# Patient Record
Sex: Female | Born: 2000 | Race: White | Hispanic: No | Marital: Single | Smoking: Never smoker
Health system: Southern US, Community
[De-identification: ages and names within clinical notes are randomized; demographics above are authoritative.]

## PROBLEM LIST (undated history)

## (undated) DIAGNOSIS — J9819 Other pulmonary collapse: Secondary | ICD-10-CM

## (undated) DIAGNOSIS — R000 Tachycardia, unspecified: Secondary | ICD-10-CM

## (undated) DIAGNOSIS — I951 Orthostatic hypotension: Secondary | ICD-10-CM

## (undated) DIAGNOSIS — G90A Postural orthostatic tachycardia syndrome (POTS): Secondary | ICD-10-CM

## (undated) HISTORY — PX: TONSILLECTOMY: SUR1361

## (undated) HISTORY — PX: OTHER SURGICAL HISTORY: SHX169

## (undated) HISTORY — DX: Tachycardia, unspecified: R00.0

---
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
## 2019-01-03 LAB — NUSWAB VAGINITIS PLUS (VG+)
Candida albicans, NAA: NEGATIVE
Candida glabrata, NAA: NEGATIVE
Chlamydia trachomatis, NAA: NEGATIVE
Neisseria gonorrhoeae, NAA: NEGATIVE
Trich vag by NAA: NEGATIVE

## 2020-06-19 ENCOUNTER — Telehealth: Payer: Self-pay | Admitting: Obstetrics and Gynecology

## 2020-06-19 NOTE — Telephone Encounter (Signed)
Patient is scheduled for annual on 07/16/2020 at 1:30 pm with dr. Bonney Aid , pt is requesting Mirena IUD replacement.

## 2020-06-25 NOTE — Telephone Encounter (Signed)
Noted. Will order to arrive by apt date/time. 

## 2020-07-16 ENCOUNTER — Ambulatory Visit (INDEPENDENT_AMBULATORY_CARE_PROVIDER_SITE_OTHER): Payer: BC Managed Care – PPO | Admitting: Obstetrics and Gynecology

## 2020-07-16 ENCOUNTER — Encounter: Payer: Self-pay | Admitting: Obstetrics and Gynecology

## 2020-07-16 ENCOUNTER — Other Ambulatory Visit: Payer: Self-pay

## 2020-07-16 ENCOUNTER — Other Ambulatory Visit (HOSPITAL_COMMUNITY)
Admission: RE | Admit: 2020-07-16 | Discharge: 2020-07-16 | Disposition: A | Discharge status: Home / Self Care | Payer: BC Managed Care – PPO | Source: Ambulatory Visit | Attending: Obstetrics and Gynecology | Admitting: Obstetrics and Gynecology

## 2020-07-16 VITALS — BP 122/72 | Ht 63.0 in | Wt 185.0 lb

## 2020-07-16 DIAGNOSIS — Z113 Encounter for screening for infections with a predominantly sexual mode of transmission: Secondary | ICD-10-CM | POA: Insufficient documentation

## 2020-07-16 DIAGNOSIS — Z01419 Encounter for gynecological examination (general) (routine) without abnormal findings: Secondary | ICD-10-CM | POA: Diagnosis not present

## 2020-07-16 DIAGNOSIS — Z30433 Encounter for removal and reinsertion of intrauterine contraceptive device: Secondary | ICD-10-CM

## 2020-07-16 DIAGNOSIS — L0292 Furuncle, unspecified: Secondary | ICD-10-CM

## 2020-07-16 NOTE — Progress Notes (Addendum)
Gynecology Annual Exam  PCP: Lestine Mount, PA-C  Chief Complaint:  Chief Complaint  Patient presents with   Gynecologic Exam    Annual, IUD replacement. RM 5    History of Present Illness: Patient is a 20 y.o. G0P0000 presents for annual exam. The patient has no complaints today.   LMP: No LMP recorded. (Menstrual status: IUD). No bleeding concerns with current Maria Buckley  The patient is sexually active. She currently uses IUD for contraception. She denies dyspareunia.  The patient does perform self breast exams.  There is no notable family history of breast or ovarian cancer in her family.  The patient wears seatbelts: yes.  The patient has regular exercise: not asked.    The patient denies current symptoms of depression.    Review of Systems: Review of Systems  Constitutional: Negative for chills and fever.  HENT: Negative for congestion.   Respiratory: Negative for cough and shortness of breath.   Cardiovascular: Negative for chest pain and palpitations.  Gastrointestinal: Negative for abdominal pain, constipation, diarrhea, heartburn, nausea and vomiting.  Genitourinary: Negative for dysuria, frequency and urgency.  Skin: Negative for itching and rash.  Neurological: Negative for dizziness and headaches.  Endo/Heme/Allergies: Negative for polydipsia.  Psychiatric/Behavioral: Negative for depression.    Past Medical History:  Patient Active Problem List   Diagnosis Date Noted   Acute appendicitis 06/20/2018    Past Surgical History:  Past Surgical History:  Procedure Laterality Date   APPENDECTOMY  06/2018   LAPAROSCOPIC APPENDECTOMY N/A 06/20/2018   Procedure: APPENDECTOMY LAPAROSCOPIC;  Surgeon: Benjamine Sprague, DO;  Location: [facility] ORS;  Service: General;  Laterality: N/A;   TONSILLECTOMY      Gynecologic History:  No LMP recorded. (Menstrual status: IUD). Contraception:08/15/2015 Kylena IUD Last Pap: Results NA under 21  Obstetric History:  G0P0000  Family History:  Family History  Problem Relation Age of Onset   Breast cancer Maternal Grandmother 53   Melanoma Maternal Grandmother        Malignant   Cancer Maternal Grandmother 40       has contact   Pancreatic cancer Other     Social History:  Social History   Socioeconomic History   Marital status: Single    Spouse name: Not on file   Number of children: Not on file   Years of education: Not on file   Highest education level: Not on file  Occupational History   Not on file  Tobacco Use   Smoking status: Passive Smoke Exposure - Never Smoker   Smokeless tobacco: Never Used  Vaping Use   Vaping Use: Never used  Substance and Sexual Activity   Alcohol use: No   Drug use: No   Sexual activity: Yes    Birth control/protection: I.U.D.  Other Topics Concern   Not on file  Social History Narrative   Not on file   Social Determinants of Health   Financial Resource Strain: Not on file  Food Insecurity: Not on file  Transportation Needs: Not on file  Physical Activity: Not on file  Stress: Not on file  Social Connections: Not on file  Intimate Partner Violence: Not on file    Allergies:  No Known Allergies  Medications: Prior to Admission medications   Medication Sig Start Date End Date Taking? Authorizing Provider  levonorgestrel (MIRENA) 20 MCG/24HR IUD 1 each by Intrauterine route once.    [provider]  montelukast (SINGULAIR) 10 MG tablet Take 10 mg by mouth  daily. 09/08/18   [provider]    Physical Exam Vitals: Blood pressure 122/72, height 5\' 3"  (1.6 m), weight 185 lb (83.9 kg).  General: NAD HEENT: normocephalic, anicteric Thyroid: no enlargement, no palpable nodules Pulmonary: No increased work of breathing, CTAB Cardiovascular: RRR, distal pulses 2+ Breast: Breast symmetrical, no tenderness, no palpable nodules or masses, no skin or nipple retraction present, no nipple discharge.  No axillary or  supraclavicular lymphadenopathy.  Right breast small furuncle outer lower quadrant.  No fluctuance  Abdomen: NABS, soft, non-tender, non-distended.  Umbilicus without lesions.  No hepatomegaly, splenomegaly or masses palpable. No evidence of hernia  Genitourinary:  External: Normal external female genitalia.  Normal urethral meatus, normal Bartholin's and Skene's glands.    Vagina: Normal vaginal mucosa, no evidence of prolapse.    Cervix: Grossly normal in appearance, no bleeding, IUD string visualized  Uterus: Non-enlarged, mobile, normal contour.  No CMT  Adnexa: ovaries non-enlarged, no adnexal masses  Rectal: deferred  Lymphatic: no evidence of inguinal lymphadenopathy Extremities: no edema, erythema, or tenderness Neurologic: Grossly intact Psychiatric: mood appropriate, affect full  Female chaperone present for pelvic and breast  portions of the physical exam    GYNECOLOGY OFFICE PROCEDURE NOTE  Maria Buckley is a 21 y.o. G0P0000 here for IUD removal and reinsertion. The patient currently has a Kylena IUD placed on 08/2015, which will be replaced with a Kylena IUD today.  No GYN concerns.   IUD Removal and Reinsertion  Patient identified, informed consent performed, consent signed.   Discussed risks of irregular bleeding, cramping, infection, malpositioning or uterine perforation of the IUD which may require further procedures. Time out was performed. Speculum placed in the vagina. The strings of the IUD were grasped and pulled using ring forceps. The IUD was successfully removed in its entirety. The cervix was cleaned with Betadine x 2 and grasped anteriorly with a single tooth tenaculum.  The uterus was sounded to IUD insertion apparatus was used to sound the uterus to 8 cm using a uterine sound.  The IUD was then placed per manufacturer's recommendations. Strings trimmed to 3 cm. Tenaculum was removed, good hemostasis noted. Patient tolerated procedure well.   Patient was given  post-procedure instructions.  Patient was also asked to check IUD strings periodically and follow up in 6 weeks for IUD check.   Immunization History  Administered Date(s) Administered   Influenza,inj,Quad PF,6+ Mos 06/21/2018    Assessment: 19 y.o. G0P0000 routine annual exam  Plan: Problem List Items Addressed This Visit   None   Visit Diagnoses    Encounter for gynecological examination without abnormal finding    -  Primary   Routine screening for STI (sexually transmitted infection)       Relevant Orders   Cervicovaginal ancillary only (Completed)   Encounter for removal and reinsertion of IUD       Furuncle       Relevant Medications   sulfamethoxazole-trimethoprim (BACTRIM DS) 800-160 MG tablet      1) Gardasil has had one shot in series  2) STI screening  GC/CT screening per USPTF guidelines  3)  ASCCP guidelines and rational discussed.  Routine screening starting at age 62  4) Contraception - the patient is currently using  IUD.  She is happy with her current form of contraception and plans to continue We discussed safe sex practices to reduce her furture risk of STI's.    5) Furuncle - rx bactrim  6) Return in about 6  weeks (around 08/27/2020) for IUD string check.   Vena Austria, MD, [facility], [facility] 07/16/2020, 2:22 PM

## 2020-07-18 LAB — CERVICOVAGINAL ANCILLARY ONLY
Chlamydia: NEGATIVE
Comment: NEGATIVE
Comment: NORMAL
Neisseria Gonorrhea: NEGATIVE

## 2020-07-19 ENCOUNTER — Telehealth: Payer: Self-pay | Admitting: Obstetrics and Gynecology

## 2020-07-19 NOTE — Telephone Encounter (Signed)
Pt states that prescription for apt on 07/16/2020 wasn't received by her pharmacy.Please advise

## 2020-07-20 NOTE — Telephone Encounter (Signed)
Patient is calling to follow up on missed call. Please advise

## 2020-07-20 NOTE — Telephone Encounter (Signed)
Patient calling for RX Amoxicillin, informed patient Dr. Bonney Aid is out of the office today and will return Monday.

## 2020-07-23 MED ORDER — SULFAMETHOXAZOLE-TRIMETHOPRIM 800-160 MG PO TABS: 1.0000 | ORAL_TABLET | Freq: Two times a day (BID) | ORAL | 0 refills | Status: AC

## 2020-07-23 NOTE — Telephone Encounter (Signed)
What was the amoxicillin rx for?

## 2020-07-23 NOTE — Addendum Note (Signed)
Addended by: Lorrene Reid on: 07/23/2020 02:14 PM   Modules accepted: Orders

## 2020-08-27 ENCOUNTER — Ambulatory Visit: Payer: Self-pay | Admitting: Obstetrics and Gynecology

## 2020-09-07 ENCOUNTER — Other Ambulatory Visit: Payer: Self-pay

## 2020-09-07 ENCOUNTER — Ambulatory Visit (INDEPENDENT_AMBULATORY_CARE_PROVIDER_SITE_OTHER): Payer: BC Managed Care – PPO | Admitting: Obstetrics and Gynecology

## 2020-09-07 ENCOUNTER — Encounter: Payer: Self-pay | Admitting: Obstetrics and Gynecology

## 2020-09-07 VITALS — BP 112/60 | Ht 63.0 in | Wt 192.0 lb

## 2020-09-07 DIAGNOSIS — Z30431 Encounter for routine checking of intrauterine contraceptive device: Secondary | ICD-10-CM | POA: Diagnosis not present

## 2020-09-07 NOTE — Progress Notes (Signed)
Obstetrics & Gynecology Office Visit   Chief Complaint:  Chief Complaint  Patient presents with  . Follow-up    IUD string check - no concerns. RM 4    History of Present Illness: 20 y.o. patient presenting for follow up of Mirena IUD placement 8 weeks ago.  The indication for her IUD was contraception.  She denies any complications since her IUD placement.  Still having some occasional spotting.  is able to feel strings.    Review of Systems: Review of Systems  Constitutional: Negative.   Gastrointestinal: Negative.   Genitourinary: Negative.     Past Medical History:  Past Medical History:  Diagnosis Date  . Collapsed lung   . S/P appendectomy 06/2018  . Tachycardia     Past Surgical History:  Past Surgical History:  Procedure Laterality Date  . APPENDECTOMY  06/2018  . LAPAROSCOPIC APPENDECTOMY N/A 06/20/2018   Procedure: APPENDECTOMY LAPAROSCOPIC;  Surgeon: Sung Amabile, DO;  Location: [facility] ORS;  Service: General;  Laterality: N/A;  . TONSILLECTOMY      Gynecologic History: No LMP recorded. (Menstrual status: IUD).  Obstetric History: G0P0000  Family History:  Family History  Problem Relation Age of Onset  . Breast cancer Maternal Grandmother 87  . Melanoma Maternal Grandmother        Malignant  . Cancer Maternal Grandmother 53       has contact  . Pancreatic cancer Other     Social History:  Social History   Socioeconomic History  . Marital status: Single    Spouse name: Not on file  . Number of children: Not on file  . Years of education: Not on file  . Highest education level: Not on file  Occupational History  . Not on file  Tobacco Use  . Smoking status: Passive Smoke Exposure - Never Smoker  . Smokeless tobacco: Never Used  Vaping Use  . Vaping Use: Never used  Substance and Sexual Activity  . Alcohol use: No  . Drug use: No  . Sexual activity: Yes    Birth control/protection: I.U.D.  Other Topics Concern  . Not on file  Social  History Narrative  . Not on file   Social Determinants of Health   Financial Resource Strain: Not on file  Food Insecurity: Not on file  Transportation Needs: Not on file  Physical Activity: Not on file  Stress: Not on file  Social Connections: Not on file  Intimate Partner Violence: Not on file    Allergies:  No Known Allergies  Medications: Prior to Admission medications   Medication Sig Start Date End Date Taking? Authorizing Provider  butalbital-acetaminophen-caffeine (FIORICET) 50-325-40 MG tablet Take by mouth.   Yes [provider]  fludrocortisone (FLORINEF) 0.1 MG tablet Take by mouth. 02/14/20 02/13/21 Yes [provider]  montelukast (SINGULAIR) 10 MG tablet Take 10 mg by mouth daily. 09/08/18  Yes [provider]  propranolol (INDERAL) 10 MG tablet Take by mouth. 08/21/20 08/21/21 Yes [provider]  HYDROcodone-acetaminophen (NORCO/VICODIN) 5-325 MG tablet Take by mouth. Patient not taking: Reported on 09/07/2020 06/29/20   [provider]  levonorgestrel (MIRENA) 20 MCG/24HR IUD 1 each by Intrauterine route once.    [provider]    Physical Exam Blood pressure 112/60, height 5\' 3"  (1.6 m), weight 192 lb (87.1 kg). No LMP recorded. (Menstrual status: IUD).  General: NAD HEENT: normocephalic, anicteric Pulmonary: No increased work of breathing Genitourinary:  External: Normal external female genitalia.  Normal  urethral meatus, normal  Bartholin's and Skene's glands.    Vagina: Normal vaginal mucosa, no evidence of prolapse.    Cervix: Grossly normal in appearance, no bleeding, IUD strings visualized 2cm  Uterus: Non-enlarged, mobile, normal contour.  No CMT  Adnexa: ovaries non-enlarged, no adnexal masses  Rectal: deferred  Lymphatic: no evidence of inguinal lymphadenopathy Extremities: no edema, erythema, or tenderness Neurologic: Grossly intact Psychiatric: mood appropriate, affect full  Female chaperone  present for pelvic and breast  portions of the physical exam  Assessment: 20 y.o. G0P0000 IUD string check  Plan: Problem List Items Addressed This Visit   None      1.  The patient was given instructions to check her IUD strings monthly and call with any problems or concerns.  She should call for fevers, chills, abnormal vaginal discharge, pelvic pain, or other complaints.  2.   IUDs while effective at preventing pregnancy do not prevent transmission of sexually transmitted diseases and use of barrier methods for this purpose was discussed.  Low overall incidence of failure with 99.7% efficacy rate in typical use.  The patient has not contraindication to IUD placement.  3.  She will return for a annual exam in 1 year.  All questions answered.  4) A total of 15 minutes were spent in face-to-face contact with the patient during this encounter with over half of that time devoted to counseling and coordination of care.  5) Return in about 1 year (around 09/07/2021) for Annual.   Vena Austria, MD, [facility], [facility] 09/07/2020, 2:28 PM

## 2020-10-14 IMAGING — CT CT ANGIO CHEST
2 of 6 series · 19 of 46 positions shown · IV contrast (APPLIED)
Comparison: Chest radiograph 06/18/2018

CLINICAL DATA: Right-sided chest pain.

EXAM:
CT ANGIOGRAPHY CHEST WITH CONTRAST
TECHNIQUE: Multidetector CT imaging of the chest was performed using the
standard protocol during bolus administration of intravenous
contrast. Multiplanar CT image reconstructions and MIPs were
obtained to evaluate the vascular anatomy.
CONTRAST:  125mL BUDPGP-X6W IOPAMIDOL (BUDPGP-X6W) INJECTION 76%

[Series 5: thins · axial · 0.56mm/px · z∈[-6,+204]mm · 16 of 230 slices shown]
[im 10/230  lung]
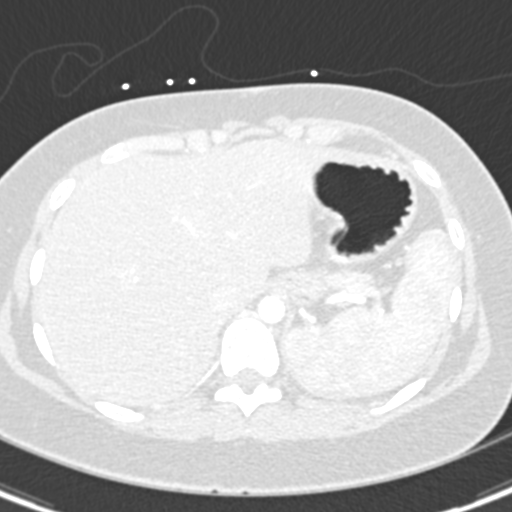
[im 30/230  soft-tissue]
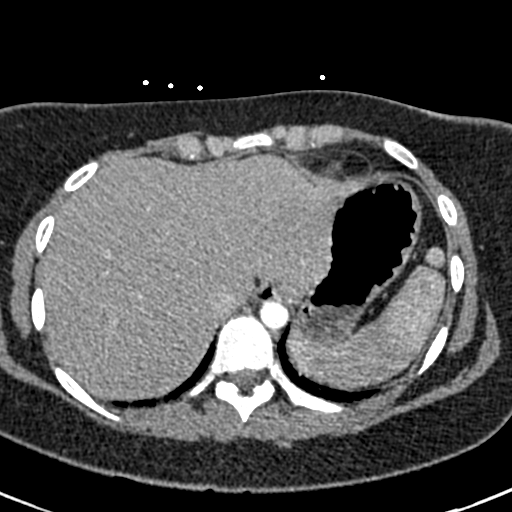
[im 40/230  lung]
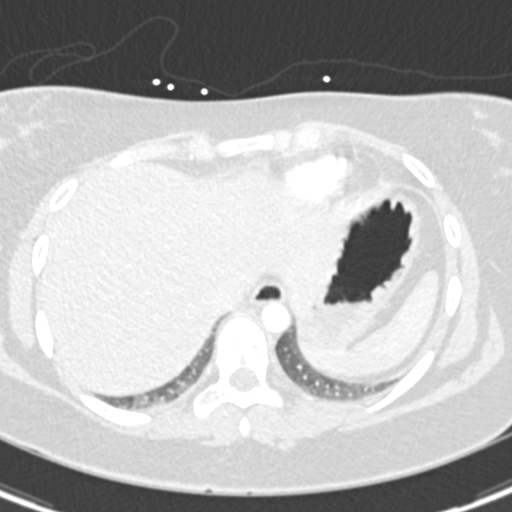
[im 50/230  soft-tissue]
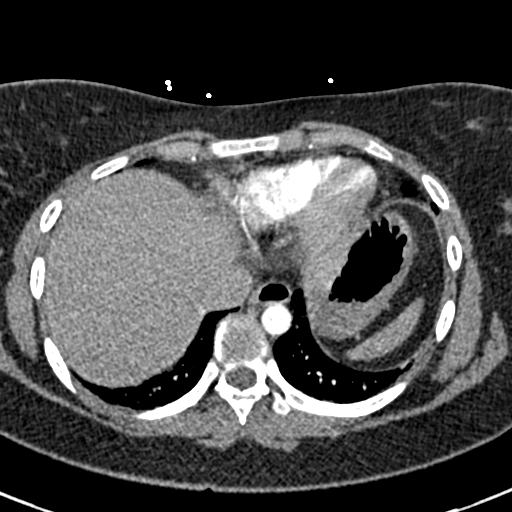
[im 70/230  lung]
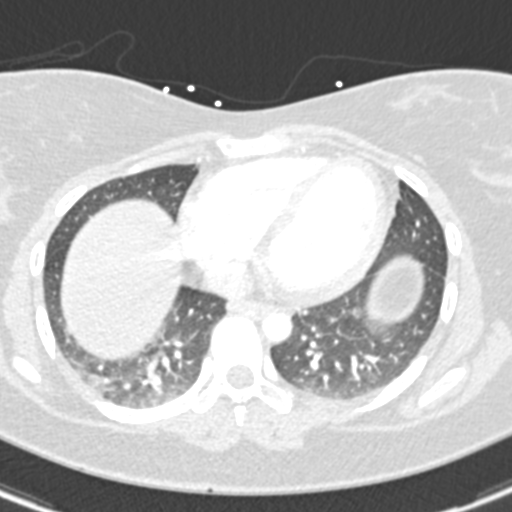
[im 80/230  soft-tissue]
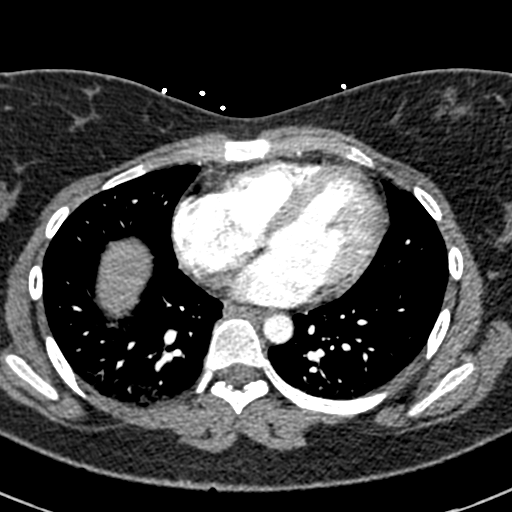
[im 90/230  lung]
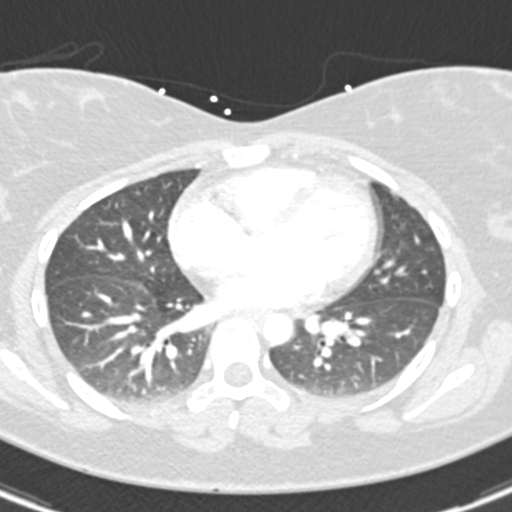
[im 110/230  soft-tissue]
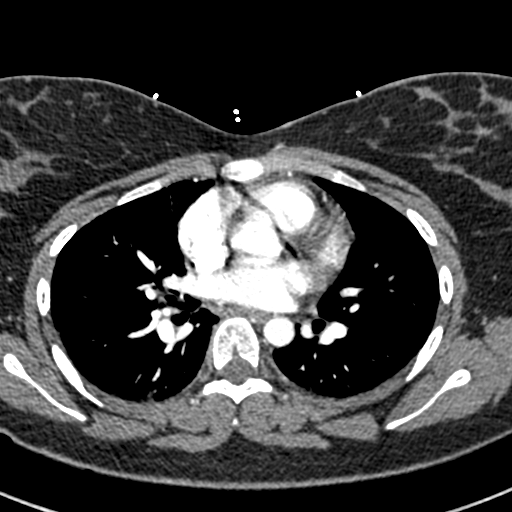
[im 120/230  lung]
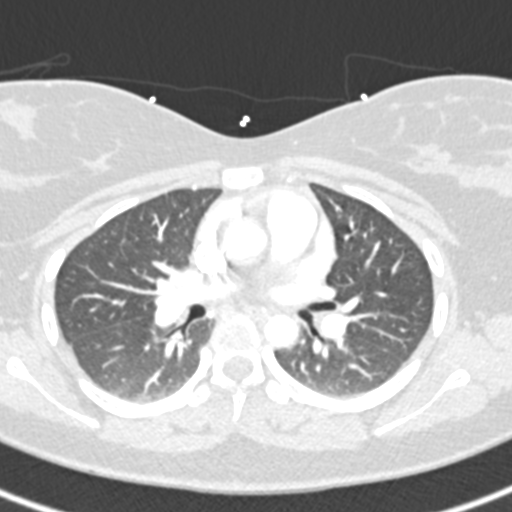
[im 140/230  soft-tissue]
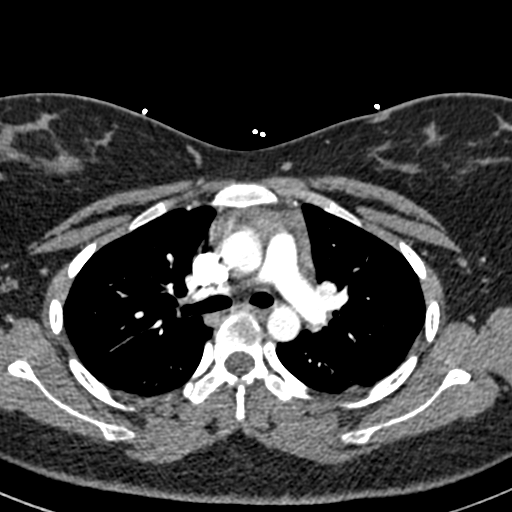
[im 150/230  lung]
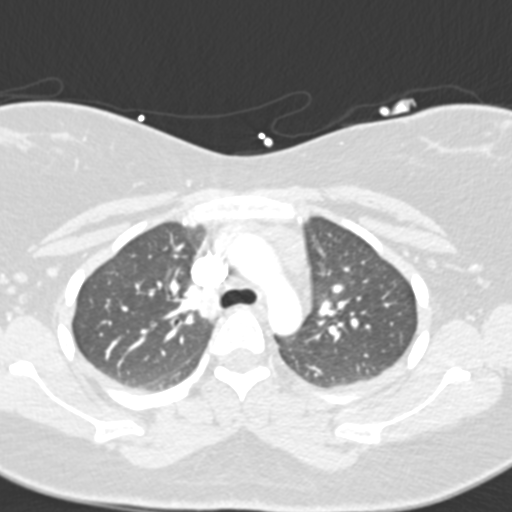
[im 160/230  soft-tissue]
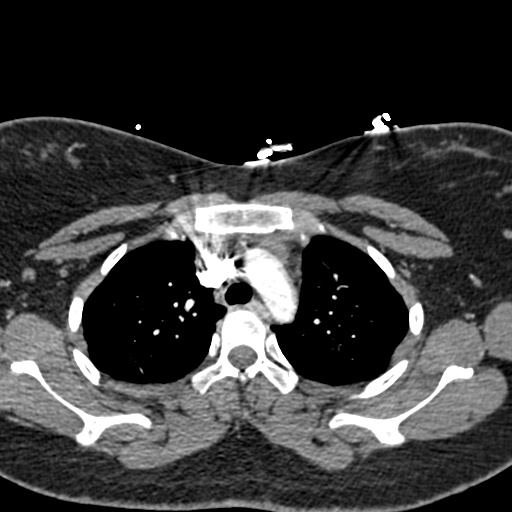
[im 180/230  lung]
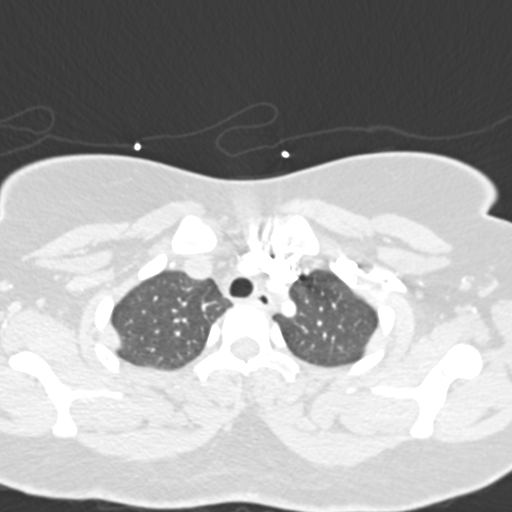
[im 190/230  soft-tissue]
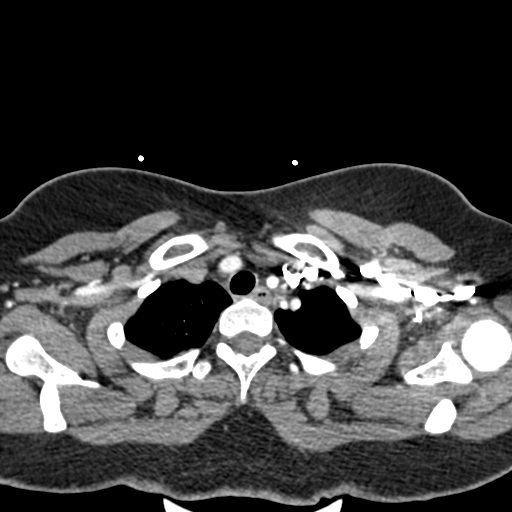
[im 200/230  lung]
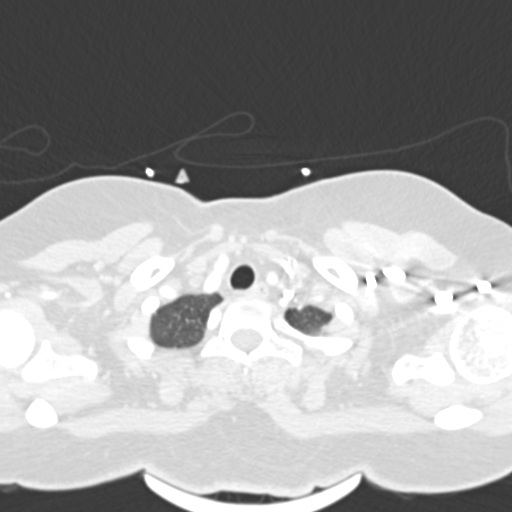
[im 220/230  soft-tissue]
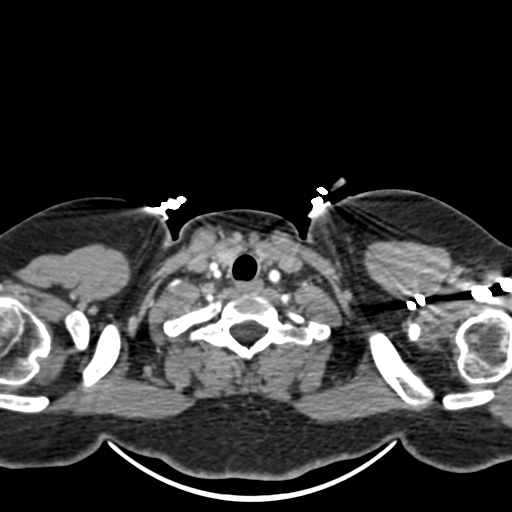

[Series 7: coronal mpr · coronal · 0.49mm/px · 3 of 69 slices shown]
[im 18/69  soft-tissue]
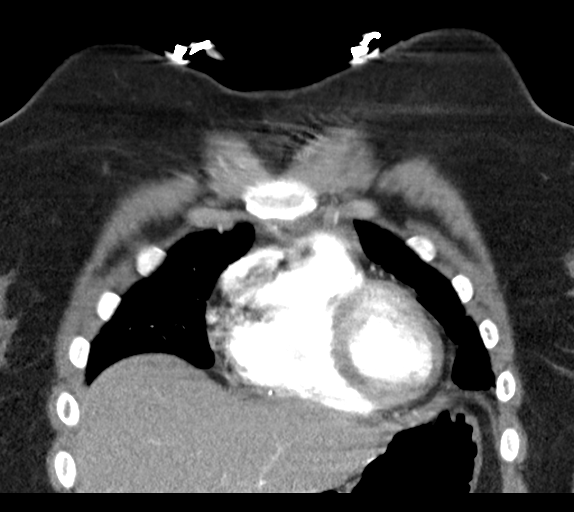
[im 35/69  soft-tissue]
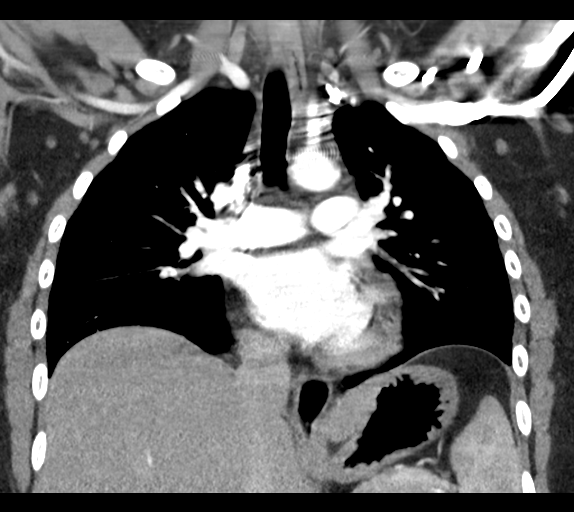
[im 52/69  soft-tissue]
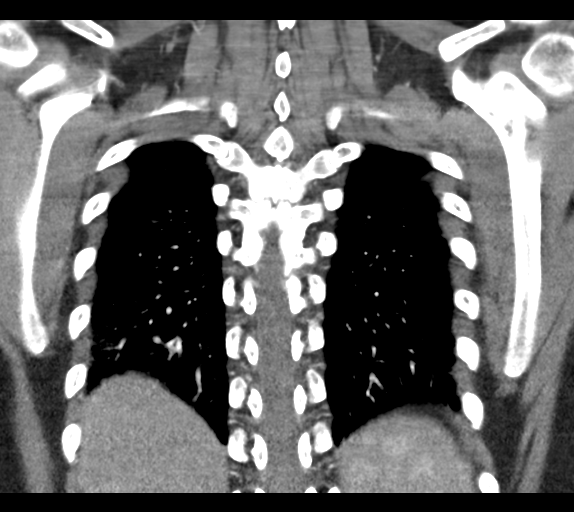

[19 of 46 positions shown; findings below may reference images not displayed]

FINDINGS: Cardiovascular: Satisfactory opacification of the pulmonary arteries
to the segmental level. No evidence of pulmonary embolism. Normal
heart size. No pericardial effusion.

Mediastinum/Nodes: No enlarged mediastinal, hilar, or axillary lymph
nodes. Thyroid gland, trachea, and esophagus demonstrate no
significant findings. Residual thymus.

Lungs/Pleura: Lungs are clear. No pleural effusion or pneumothorax.

Upper Abdomen: No acute abnormality.

Musculoskeletal: No chest wall abnormality. No acute or significant
osseous findings.

Review of the MIP images confirms the above findings.
IMPRESSION: No evidence of pulmonary embolus or other acute abnormality within
the thorax.

## 2021-05-09 NOTE — Telephone Encounter (Signed)
Patient rcvd/charged Rutha Bouchard 07/17/20

## 2021-05-21 ENCOUNTER — Encounter: Payer: Self-pay | Admitting: Obstetrics and Gynecology

## 2021-05-21 ENCOUNTER — Other Ambulatory Visit: Payer: Self-pay

## 2021-05-21 ENCOUNTER — Ambulatory Visit (INDEPENDENT_AMBULATORY_CARE_PROVIDER_SITE_OTHER): Payer: BC Managed Care – PPO | Admitting: Obstetrics and Gynecology

## 2021-05-21 VITALS — BP 120/70 | Ht 63.0 in | Wt 193.0 lb

## 2021-05-21 DIAGNOSIS — R399 Unspecified symptoms and signs involving the genitourinary system: Secondary | ICD-10-CM | POA: Diagnosis not present

## 2021-05-21 LAB — POCT URINALYSIS DIPSTICK
Bilirubin, UA: NEGATIVE
Blood, UA: NEGATIVE
Glucose, UA: NEGATIVE
Ketones, UA: NEGATIVE
Nitrite, UA: NEGATIVE
Protein, UA: NEGATIVE
Spec Grav, UA: 1.02 (ref 1.010–1.025)
pH, UA: 6 (ref 5.0–8.0)

## 2021-05-21 MED ORDER — NITROFURANTOIN MONOHYD MACRO 100 MG PO CAPS: 100.0000 mg | ORAL_CAPSULE | Freq: Two times a day (BID) | ORAL | 0 refills | Status: AC

## 2021-05-21 NOTE — Patient Instructions (Signed)
I value your feedback and you entrusting us with your care. If you get a [facility] patient survey, I would appreciate you taking the time to let us know about your experience today. Thank you! ? ? ?

## 2021-05-21 NOTE — Progress Notes (Signed)
Tim Lair, PA-C   Chief Complaint  Patient presents with   Urinary Tract Infection    Frequency and discomfort urinating x 3 days    HPI:      Ms. Maria Buckley is a 20 y.o. G0P0000 whose LMP was No LMP recorded. (Menstrual status: IUD)., presents today for UTI sx of urinary frequency and urgency, starting a couple days ago, but sx worse today. Has discomfort with urinating as well as urine odor. No dysuria, hematuria, LBP, pelvic pain, fevers. No vag sx. Hx of UTIs in childhood. Had GI virus a few days before sx started with fever and dehydration. Drinking water again, has had a few caffeinated sodas recently.  Current on annual 1/22  Past Medical History:  Diagnosis Date   Collapsed lung    S/P appendectomy 06/2018   Tachycardia     Past Surgical History:  Procedure Laterality Date   APPENDECTOMY  06/2018   LAPAROSCOPIC APPENDECTOMY N/A 06/20/2018   Procedure: APPENDECTOMY LAPAROSCOPIC;  Surgeon: Sung Amabile, DO;  Location: [facility] ORS;  Service: General;  Laterality: N/A;   TONSILLECTOMY      Family History  Problem Relation Age of Onset   Scoliosis Mother    Other Mother        Disgenerative disc disease   Breast cancer Maternal Grandmother 26   Melanoma Maternal Grandmother        Malignant   Cancer Maternal Grandmother 54       has contact   Pancreatic cancer Other     Social History   Socioeconomic History   Marital status: Single    Spouse name: Not on file   Number of children: Not on file   Years of education: Not on file   Highest education level: Not on file  Occupational History   Not on file  Tobacco Use   Smoking status: Never    Passive exposure: Yes   Smokeless tobacco: Never  Vaping Use   Vaping Use: Never used  Substance and Sexual Activity   Alcohol use: No   Drug use: No   Sexual activity: Yes    Birth control/protection: I.U.D.    Comment: Mirena  Other Topics Concern   Not on file  Social History Narrative   Not  on file   Social Determinants of Health   Financial Resource Strain: Not on file  Food Insecurity: Not on file  Transportation Needs: Not on file  Physical Activity: Not on file  Stress: Not on file  Social Connections: Not on file  Intimate Partner Violence: Not on file    Outpatient Medications Prior to Visit  Medication Sig Dispense Refill   budesonide (RHINOCORT AQUA) 32 MCG/ACT nasal spray Place into the nose.     levonorgestrel (MIRENA) 20 MCG/24HR IUD 1 each by Intrauterine route once.     montelukast (SINGULAIR) 10 MG tablet Take 10 mg by mouth daily.     naratriptan (AMERGE) 2.5 MG tablet Take by mouth.     nebivolol (BYSTOLIC) 2.5 MG tablet Take 2.5 mg by mouth daily.     topiramate (TOPAMAX) 100 MG tablet Take 100 mg by mouth at bedtime.     butalbital-acetaminophen-caffeine (FIORICET) 50-325-40 MG tablet Take by mouth.     HYDROcodone-acetaminophen (NORCO/VICODIN) 5-325 MG tablet Take by mouth. (Patient not taking: Reported on 09/07/2020)     propranolol (INDERAL) 10 MG tablet Take by mouth.     No facility-administered medications prior to visit.  ROS:  Review of Systems  Constitutional:  Negative for fever.  Gastrointestinal:  Negative for blood in stool, constipation, diarrhea, nausea and vomiting.  Genitourinary:  Positive for frequency and urgency. Negative for dyspareunia, dysuria, flank pain, hematuria, vaginal bleeding, vaginal discharge and vaginal pain.  Musculoskeletal:  Negative for back pain.  Skin:  Negative for rash.  BREAST: No symptoms   OBJECTIVE:   Vitals:  BP 120/70   Ht 5\' 3"  (1.6 m)   Wt 193 lb (87.5 kg)   BMI 34.19 kg/m   Physical Exam Vitals reviewed.  Constitutional:      Appearance: She is well-developed.  Pulmonary:     Effort: Pulmonary effort is normal.  Musculoskeletal:        General: Normal range of motion.     Cervical back: Normal range of motion.  Skin:    General: Skin is warm and dry.  Neurological:      General: No focal deficit present.     Mental Status: She is alert and oriented to person, place, and time.     Cranial Nerves: No cranial nerve deficit.  Psychiatric:        Mood and Affect: Mood normal.        Behavior: Behavior normal.        Thought Content: Thought content normal.        Judgment: Judgment normal.    Results: Results for orders placed or performed in visit on 05/21/21 (from the past 24 hour(s))  POCT Urinalysis Dipstick     Status: Abnormal   Collection Time: 05/21/21  2:56 PM  Result Value Ref Range   Color, UA yellow    Clarity, UA clear    Glucose, UA Negative Negative   Bilirubin, UA neg    Ketones, UA neg    Spec Grav, UA 1.020 1.010 - 1.025   Blood, UA neg    pH, UA 6.0 5.0 - 8.0   Protein, UA Negative Negative   Urobilinogen, UA     Nitrite, UA neg    Leukocytes, UA Trace (A) Negative   Appearance     Odor       Assessment/Plan: UTI symptoms - Plan: nitrofurantoin, macrocrystal-monohydrate, (MACROBID) 100 MG capsule, POCT Urinalysis Dipstick, Urine Culture; neg UA, check C&S. Given worsening sx, will treat with macrobid. Increase water/d/c caffeine. F/u prn.    Meds ordered this encounter  Medications   nitrofurantoin, macrocrystal-monohydrate, (MACROBID) 100 MG capsule    Sig: Take 1 capsule (100 mg total) by mouth 2 (two) times daily for 5 days.    Dispense:  10 capsule    Refill:  0    Order Specific Question:   Supervising Provider    Answer:   Gae Dry [id number]      Return if symptoms worsen or fail to improve.  Manjit Bufano B. Yerick Eggebrecht, PA-C 05/21/2021 2:57 PM

## 2021-05-26 LAB — URINE CULTURE

## 2021-11-22 HISTORY — PX: GANGLION CYST EXCISION: SHX1691

## 2021-11-22 HISTORY — PX: TENDON REPAIR: SHX5111

## 2022-04-03 ENCOUNTER — Emergency Department: Payer: BC Managed Care – PPO

## 2022-04-03 ENCOUNTER — Emergency Department
Admission: EM | Admit: 2022-04-03 | Discharge: 2022-04-03 | Disposition: A | Discharge status: Home / Self Care | Payer: BC Managed Care – PPO | Attending: Emergency Medicine | Admitting: Emergency Medicine

## 2022-04-03 ENCOUNTER — Other Ambulatory Visit: Payer: Self-pay

## 2022-04-03 ENCOUNTER — Encounter: Payer: Self-pay | Admitting: Emergency Medicine

## 2022-04-03 DIAGNOSIS — R1011 Right upper quadrant pain: Secondary | ICD-10-CM | POA: Diagnosis present

## 2022-04-03 DIAGNOSIS — N200 Calculus of kidney: Secondary | ICD-10-CM

## 2022-04-03 DIAGNOSIS — N201 Calculus of ureter: Secondary | ICD-10-CM | POA: Insufficient documentation

## 2022-04-03 LAB — CBC
HCT: 38.9 % (ref 36.0–46.0)
Hemoglobin: 12.8 g/dL (ref 12.0–15.0)
MCH: 28.4 pg (ref 26.0–34.0)
MCHC: 32.9 g/dL (ref 30.0–36.0)
MCV: 86.4 fL (ref 80.0–100.0)
Platelets: 393 10*3/uL (ref 150–400)
RBC: 4.5 MIL/uL (ref 3.87–5.11)
RDW: 12.1 % (ref 11.5–15.5)
WBC: 14.1 10*3/uL — ABNORMAL HIGH (ref 4.0–10.5)
nRBC: 0 % (ref 0.0–0.2)

## 2022-04-03 LAB — URINALYSIS, COMPLETE (UACMP) WITH MICROSCOPIC
Bilirubin Urine: NEGATIVE
Glucose, UA: NEGATIVE mg/dL
Ketones, ur: NEGATIVE mg/dL
Leukocytes,Ua: NEGATIVE
Nitrite: NEGATIVE
RBC / HPF: >50 RBC/hpf — ABNORMAL HIGH (ref 0–5)
Specific Gravity, Urine: 1.025 (ref 1.005–1.030)
pH: 5.5 (ref 5.0–8.0)

## 2022-04-03 LAB — COMPREHENSIVE METABOLIC PANEL
ALT: 22 U/L (ref 0–44)
AST: 19 U/L (ref 15–41)
Albumin: 4.5 g/dL (ref 3.5–5.0)
Alkaline Phosphatase: 74 U/L (ref 38–126)
Anion gap: 12 (ref 5–15)
BUN: 18 mg/dL (ref 6–20)
CO2: 21 mmol/L — ABNORMAL LOW (ref 22–32)
Calcium: 9.6 mg/dL (ref 8.9–10.3)
Chloride: 100 mmol/L (ref 98–111)
Creatinine, Ser: 0.81 mg/dL (ref 0.44–1.00)
GFR, Estimated: >60 mL/min (ref >60)
Glucose, Bld: 112 mg/dL — ABNORMAL HIGH (ref 70–99)
Potassium: 3.4 mmol/L — ABNORMAL LOW (ref 3.5–5.1)
Sodium: 133 mmol/L — ABNORMAL LOW (ref 135–145)
Total Bilirubin: 0.7 mg/dL (ref 0.3–1.2)
Total Protein: 8.3 g/dL — ABNORMAL HIGH (ref 6.5–8.1)

## 2022-04-03 LAB — LIPASE, BLOOD: Lipase: 27 U/L (ref 11–51)

## 2022-04-03 LAB — POC URINE PREG, ED: Preg Test, Ur: NEGATIVE

## 2022-04-03 MED ORDER — ONDANSETRON HCL 4 MG/2ML IJ SOLN
4.0000 mg | Freq: Once | INTRAMUSCULAR | Status: AC
  Administered 2022-04-03: 4 mg via INTRAVENOUS
  Filled 2022-04-03: qty 2

## 2022-04-03 MED ORDER — TAMSULOSIN HCL 0.4 MG PO CAPS: 0.4000 mg | ORAL_CAPSULE | Freq: Every day | ORAL | 0 refills | Status: DC

## 2022-04-03 MED ORDER — FENTANYL CITRATE PF 50 MCG/ML IJ SOSY
100.0000 ug | PREFILLED_SYRINGE | Freq: Once | INTRAMUSCULAR | Status: AC
  Administered 2022-04-03: 100 ug via INTRAVENOUS
  Filled 2022-04-03: qty 2

## 2022-04-03 MED ORDER — MORPHINE SULFATE (PF) 4 MG/ML IV SOLN
4.0000 mg | Freq: Once | INTRAVENOUS | Status: AC
  Administered 2022-04-03: 4 mg via INTRAVENOUS
  Filled 2022-04-03: qty 1

## 2022-04-03 MED ORDER — KETOROLAC TROMETHAMINE 30 MG/ML IJ SOLN
15.0000 mg | Freq: Once | INTRAMUSCULAR | Status: AC
  Administered 2022-04-03: 15 mg via INTRAVENOUS
  Filled 2022-04-03: qty 1

## 2022-04-03 MED ORDER — KETOROLAC TROMETHAMINE 10 MG PO TABS: 10.0000 mg | ORAL_TABLET | Freq: Four times a day (QID) | ORAL | 0 refills | Status: DC | PRN

## 2022-04-03 MED ORDER — SODIUM CHLORIDE 0.9 % IV BOLUS
1000.0000 mL | Freq: Once | INTRAVENOUS | Status: AC
  Administered 2022-04-03: 1000 mL via INTRAVENOUS

## 2022-04-03 NOTE — ED Triage Notes (Signed)
Pt to triage via w/c, appears uncomfortable, grimacing; pt c/o rt sided abd pain tonight, nonradiating with no accomp symptoms; denies hx of same, st has been taking a fiber supplement; last BM yesterday

## 2022-04-03 NOTE — ED Provider Notes (Signed)
[facility] Provider Note    Event Date/Time   First MD Initiated Contact with Patient 04/03/22 2030     (approximate)  History   Chief Complaint: Abdominal Pain  HPI  Maria Buckley is a 21 y.o. female with no significant past medical history presents to the emergency department for right-sided abdominal pain.  According to the patient approximately 1.5 hours ago she developed acute onset sudden right-sided abdominal pain in the right upper quadrant of her abdomen.  Patient is status post appendectomy.  Patient states she has had 1 prior kidney stone as well but states this does not feel like a kidney stone.  Patient denies any recent dysuria or hematuria.  Denies any fever.  Denies any vomiting.  States she has been somewhat constipated and took fiber supplements today and then developed right-sided abdominal pain this evening.  Patient states her last menstrual period was quite sometime ago as she has an IUD.  Physical Exam   Triage Vital Signs: ED Triage Vitals  Enc Vitals Group     BP 04/03/22 2027 (!) 135/99     Pulse Rate 04/03/22 2027 (!) 129     Resp 04/03/22 2027 (!) 28     Temp --      Temp src --      SpO2 04/03/22 2027 99 %     Weight 04/03/22 2022 200 lb (90.7 kg)     Height 04/03/22 2022 5\' 3"  (1.6 m)     Head Circumference --      Peak Flow --      Pain Score 04/03/22 2021 10     Pain Loc --      Pain Edu? --      Excl. in [place]? --     Most recent vital signs: Vitals:   04/03/22 2027  BP: (!) 135/99  Pulse: (!) 129  Resp: (!) 28  SpO2: 99%    General: Awake, moderate distress holding her right upper quadrant appears to be uncomfortable CV:  Good peripheral perfusion.  Regular rate and rhythm  Resp:  Normal effort.  Equal breath sounds bilaterally.  Abd:  No distention.  Soft, mild to moderate right upper quadrant tenderness to palpation without rebound or guarding abdomen is otherwise benign besides slight suprapubic  tenderness.    ED Results / Procedures / Treatments    RADIOLOGY  I have reviewed and interpreted the CT images.  Patient appears to have a right-sided UVJ stone. Radiology has read the CT scan as a distal right ureteral 3 mm stone.   MEDICATIONS ORDERED IN ED: Medications  morphine (PF) 4 MG/ML injection 4 mg (has no administration in time range)  ondansetron (ZOFRAN) injection 4 mg (has no administration in time range)  sodium chloride 0.9 % bolus 1,000 mL (has no administration in time range)     IMPRESSION / MDM / ASSESSMENT AND PLAN / ED COURSE  I reviewed the triage vital signs and the nursing notes.  Patient's presentation is most consistent with acute presentation with potential threat to life or bodily function.  Patient presents emergency department for right upper quadrant abdominal pain.  Patient is tachycardic and mildly tachypneic appears uncomfortable holding her right upper quadrant.  Given the patient's right upper quadrant pain we will obtain a right upper quadrant ultrasound to further evaluate.  Differential would also include ureterolithiasis, stump appendicitis, colitis, diverticulitis, constipation/intestinal pain, UTI or pyelonephritis.  We will check labs, urine.  We will treat  pain nausea and IV hydrate while awaiting results.  Patient agreeable to plan of care.  Patient's work-up shows hematuria on her urinalysis we will send a urine culture.  CT scan shows a 3 mm distal right ureteral stone.  Patient states her pain was controlled but is now starting to come back we will dose Toradol and reassess.  The remainder the patient's lab work is reassuring with a mild leukocytosis likely pain induced, chemistry is normal including LFTs and a normal lipase.  Patient is feeling much better.  We will discharge with Toradol tablets, Flomax as well as urology follow-up.  Discussed using a urine strainer with the patient as well as my typical kidney stone return  precautions.  FINAL CLINICAL IMPRESSION(S) / ED DIAGNOSES   Ureterolithiasis  Note:  This document was prepared using Dragon voice recognition software and may include unintentional dictation errors.   Minna Antis, MD 04/03/22 2249

## 2022-04-03 NOTE — Discharge Instructions (Signed)
As discussed please return to the emergency department for any fever any painful urination or any other symptom personally concerning to yourself.  Please use your urine strainer and follow-up with urology by calling the number provided to arrange a appointment.

## 2022-04-05 LAB — URINE CULTURE: Culture: <10000 — AB

## 2022-04-17 ENCOUNTER — Ambulatory Visit (INDEPENDENT_AMBULATORY_CARE_PROVIDER_SITE_OTHER): Payer: BC Managed Care – PPO | Admitting: Urology

## 2022-04-17 ENCOUNTER — Encounter: Payer: Self-pay | Admitting: Urology

## 2022-04-17 VITALS — BP 115/77 | HR 109 | Ht 63.0 in | Wt 206.0 lb

## 2022-04-17 DIAGNOSIS — Z87442 Personal history of urinary calculi: Secondary | ICD-10-CM | POA: Diagnosis not present

## 2022-04-17 DIAGNOSIS — N200 Calculus of kidney: Secondary | ICD-10-CM

## 2022-04-17 LAB — URINALYSIS, COMPLETE
Bilirubin, UA: NEGATIVE
Glucose, UA: NEGATIVE
Ketones, UA: NEGATIVE
Leukocytes,UA: NEGATIVE
Nitrite, UA: NEGATIVE
Protein,UA: NEGATIVE
RBC, UA: NEGATIVE
Specific Gravity, UA: 1.03 (ref 1.005–1.030)
Urobilinogen, Ur: 0.2 mg/dL (ref 0.2–1.0)
pH, UA: 6 (ref 5.0–7.5)

## 2022-04-17 LAB — MICROSCOPIC EXAMINATION: Epithelial Cells (non renal): >10 /hpf — AB (ref 0–10)

## 2022-04-17 NOTE — Progress Notes (Signed)
04/17/2022 10:03 AM   Deveron Furlong [birth year] [id number]  Referring provider: Tim Lair, PA-C [address] [place],  [place] [address]  Chief Complaint  Patient presents with   New Patient (Initial Visit)   Nephrolithiasis    HPI: 21 year old female with a personal history of nephrolithiasis who presents today after an emergency room visit for urology follow-up.  She experienced acute onset severe right-sided flank pain with nausea and vomiting.  She was seen in the emergency room on 04/03/2022 found to have a 3 mm right distal ureteral calculus as well as a 1 mm right nonobstructing stone and a punctate stone on the left.  Her pain is able to be controlled, no signs of infection and that she was discharged home.  She intervally passed a stone about 3 days later and brings it with her today.  She is no longer having flank pain dysuria or gross hematuria.  No fevers or chills.  She reports today that she had a stone episode about a year ago necessitating emergency room evaluation.  She was able to pass the stone spontaneously as well.  She drinks copious amounts of water, at least close to 100 ounces daily because of her personal history of POTS and radicular blood pressure stable.  She has been doing working with a nutritionist.  She does eat a large amount of sodium.  She no longer eats red meat.   PMH: Past Medical History:  Diagnosis Date   Collapsed lung    S/P appendectomy 06/2018   Tachycardia     Surgical History: Past Surgical History:  Procedure Laterality Date   APPENDECTOMY  06/2018   LAPAROSCOPIC APPENDECTOMY N/A 06/20/2018   Procedure: APPENDECTOMY LAPAROSCOPIC;  Surgeon: Sung Amabile, DO;  Location: [facility] ORS;  Service: General;  Laterality: N/A;   TONSILLECTOMY      Home Medications:  Allergies as of 04/17/2022       Reactions   Mangifera Indica Nausea Only   Tongue numb and tingling        Medication List         Accurate as of April 17, 2022 10:03 AM. If you have any questions, ask your nurse or doctor.          STOP taking these medications    budesonide 32 MCG/ACT nasal spray Commonly known as: RHINOCORT AQUA Stopped by: Vanna Scotland, MD   naratriptan 2.5 MG tablet Commonly known as: AMERGE Stopped by: Vanna Scotland, MD   tamsulosin 0.4 MG Caps capsule Commonly known as: FLOMAX Stopped by: Vanna Scotland, MD   topiramate 100 MG tablet Commonly known as: TOPAMAX Stopped by: Vanna Scotland, MD       TAKE these medications    EMGALITY [place] Inject into the skin.   ketorolac 10 MG tablet Commonly known as: TORADOL Take 1 tablet (10 mg total) by mouth every 6 (six) hours as needed.   levonorgestrel 20 MCG/24HR IUD Commonly known as: MIRENA 1 each by Intrauterine route once.   montelukast 10 MG tablet Commonly known as: SINGULAIR Take 10 mg by mouth daily.   nebivolol 2.5 MG tablet Commonly known as: BYSTOLIC Take 2.5 mg by mouth daily.        Allergies:  Allergies  Allergen Reactions   Mangifera Indica Nausea Only    Tongue numb and tingling    Family History: Family History  Problem Relation Age of Onset   Scoliosis Mother    Other Mother  Disgenerative disc disease   Breast cancer Maternal Grandmother 57   Melanoma Maternal Grandmother        Malignant   Cancer Maternal Grandmother 46       has contact   Pancreatic cancer Other     Social History:  reports that she has never smoked. She has been exposed to tobacco smoke. She has never used smokeless tobacco. She reports that she does not drink alcohol and does not use drugs.   Physical Exam: BP 115/77   Pulse (!) 109   Ht 5\' 3"  (1.6 m)   Wt 206 lb (93.4 kg)   BMI 36.49 kg/m   Constitutional:  Alert and oriented, No acute distress. HEENT: [place] AT, moist mucus membranes.  Trachea midline, no masses. Cardiovascular: No clubbing, cyanosis, or edema. Neurologic: Grossly intact, no focal  deficits, moving all 4 extremities. Psychiatric: Normal mood and affect.  Laboratory Data: Lab Results  Component Value Date   WBC 14.1 (H) 04/03/2022   HGB 12.8 04/03/2022   HCT 38.9 04/03/2022   MCV 86.4 04/03/2022   PLT 393 04/03/2022    Lab Results  Component Value Date   CREATININE 0.81 04/03/2022    Urinalysis UA today is unremarkable  Pertinent Imaging: CT Renal Stone Study  Narrative CLINICAL DATA:  Right-sided abdominal pain.  EXAM: CT ABDOMEN AND PELVIS WITHOUT CONTRAST  TECHNIQUE: Multidetector CT imaging of the abdomen and pelvis was performed following the standard protocol without IV contrast.  RADIATION DOSE REDUCTION: This exam was performed according to the departmental dose-optimization program which includes automated exposure control, adjustment of the mA and/or kV according to patient size and/or use of iterative reconstruction technique.  COMPARISON:  June 20, 2018  FINDINGS: Lower chest: No acute abnormality.  Hepatobiliary: No focal liver abnormality is seen. No gallstones, gallbladder wall thickening, or biliary dilatation.  Pancreas: Unremarkable. No pancreatic ductal dilatation or surrounding inflammatory changes.  Spleen: Normal in size without focal abnormality.  Adrenals/Urinary Tract: Adrenal glands are unremarkable. Kidneys are normal in size, without focal lesions. A 3 mm obstructing renal calculus is seen within the distal right ureter, near the right UVJ. There is mild right-sided hydronephrosis and hydroureter. A 1 mm nonobstructing renal calculus is seen within the mid right kidney. Bladder is unremarkable.  Stomach/Bowel: Stomach is within normal limits. The appendix is surgically absent. No evidence of bowel wall thickening, distention, or inflammatory changes.  Vascular/Lymphatic: No significant vascular findings are present. No enlarged abdominal or pelvic lymph nodes.  Reproductive: An IUD is properly  positioned within a normal appearing uterus. The bilateral adnexa are unremarkable.  Other: No abdominal wall hernia or abnormality. No abdominopelvic ascites.  Musculoskeletal: No acute or significant osseous findings.  IMPRESSION: 1. 3 mm obstructing renal calculus within the distal right ureter. 2. 1 mm nonobstructing right renal calculus.   Electronically Signed By: Virgina Norfolk M.D. On: 04/03/2022 21:56  Above CT scan was personally reviewed, agree with radiologic interpretation.  Assessment & Plan:    1. Kidney stones Interval passage was 3 mm distal stone which she brings with her today  Discussed with her not to send this for stone analysis, she is interested in knowing what kind of stone she made thus we will send it.  We discussed general stone prevention techniques including drinking plenty water with goal of producing 2.5 L urine daily, increased citric acid intake, avoidance of high oxalate containing foods, and decreased salt intake.  Information about dietary recommendations given today.  \Would  not recommend intervention for her punctate bilateral nonobstructing stones  - Urinalysis, Complete  Follow-up as needed  Vanna Scotland, MD  [facility] [address] [place], [place] [address] [phone]

## 2022-04-20 ENCOUNTER — Encounter: Payer: Self-pay | Admitting: Urology

## 2022-04-24 LAB — CALCULI, WITH PHOTOGRAPH (CLINICAL LAB)
Calcium Oxalate Monohydrate: 20 %
Hydroxyapatite: 80 %
Weight Calculi: 13 mg

## 2022-04-26 ENCOUNTER — Ambulatory Visit
Admission: EM | Admit: 2022-04-26 | Discharge: 2022-04-26 | Disposition: A | Discharge status: Home / Self Care | Payer: BC Managed Care – PPO | Attending: Emergency Medicine | Admitting: Emergency Medicine

## 2022-04-26 DIAGNOSIS — J029 Acute pharyngitis, unspecified: Secondary | ICD-10-CM | POA: Insufficient documentation

## 2022-04-26 DIAGNOSIS — R0602 Shortness of breath: Secondary | ICD-10-CM | POA: Diagnosis present

## 2022-04-26 DIAGNOSIS — J069 Acute upper respiratory infection, unspecified: Secondary | ICD-10-CM

## 2022-04-26 DIAGNOSIS — Z1152 Encounter for screening for COVID-19: Secondary | ICD-10-CM | POA: Diagnosis not present

## 2022-04-26 LAB — RESP PANEL BY RT-PCR (FLU A&B, COVID) ARPGX2
Influenza A by PCR: NEGATIVE
Influenza B by PCR: NEGATIVE
SARS Coronavirus 2 by RT PCR: NEGATIVE

## 2022-04-26 LAB — GROUP A STREP BY PCR: Group A Strep by PCR: NOT DETECTED

## 2022-04-26 MED ORDER — IBUPROFEN 600 MG PO TABS
600.0000 mg | ORAL_TABLET | Freq: Once | ORAL | Status: AC
  Administered 2022-04-26: 600 mg via ORAL

## 2022-04-26 MED ORDER — ACETAMINOPHEN 500 MG PO TABS: 1000.0000 mg | ORAL_TABLET | Freq: Once | ORAL | Status: DC

## 2022-04-26 NOTE — ED Triage Notes (Signed)
Pt c/o headache, sore throat, SOB, temperature of 102.3, bodyaches x3days  Pt took a home covid test and it was negative.   Pt blew "something" out of her nose last night and states it had a vein and was bloody. Pt denies any nose bleeds.

## 2022-04-26 NOTE — Discharge Instructions (Signed)
Your strep and covid are negative. Rest,push fluids, may alternate tylenol/ibuprofen as label directed. Chloraseptic throat lozenges while awake. Follow up with PCP.  

## 2022-04-26 NOTE — ED Provider Notes (Addendum)
[facility]    CSN: [id number] Arrival date & time: 04/26/22  4098      History   Chief Complaint Chief Complaint  Patient presents with   Sore Throat   Headache   Shortness of Breath    HPI Maria Buckley is a 21 y.o. female.   21 year old female, Maria Buckley, presents to urgent care with chief complaint of sore throat ,headache,SOB/congestion x 3 days. Pt took Dayquil PTA. Ibuprofen given in UC. 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 Pt has Mirena.   The history is provided by the patient. No language interpreter was used.    Past Medical History:  Diagnosis Date   Collapsed lung    S/P appendectomy 06/2018   Tachycardia     Patient Active Problem List   Diagnosis Date Noted   Viral URI 04/26/2022   Acute appendicitis 06/20/2018    Past Surgical History:  Procedure Laterality Date   APPENDECTOMY  06/2018   GANGLION CYST EXCISION Right 11/22/2021   LAPAROSCOPIC APPENDECTOMY N/A 06/20/2018   Procedure: APPENDECTOMY LAPAROSCOPIC;  Surgeon: Benjamine Sprague, DO;  Location: [facility] ORS;  Service: General;  Laterality: N/A;   TENDON REPAIR Right 11/22/2021   TONSILLECTOMY      OB History     Gravida  0   Para  0   Term  0   Preterm  0   AB  0   Living  0      SAB  0   IAB  0   Ectopic  0   Multiple  0   Live Births  0            Home Medications    Prior to Admission medications   Medication Sig Start Date End Date Taking? Authorizing Provider  Galcanezumab-gnlm [facility]) Inject into the skin.   Yes [provider]  ketorolac (TORADOL) 10 MG tablet Take 1 tablet (10 mg total) by mouth every 6 (six) hours as needed. 04/03/22  Yes Harvest Dark, MD  levonorgestrel (MIRENA) 20 MCG/24HR IUD 1 each by Intrauterine route once.   Yes [provider]  montelukast (SINGULAIR) 10 MG tablet Take 10 mg by mouth daily. 09/08/18  Yes [provider]  nebivolol (BYSTOLIC) 2.5  MG tablet Take 2.5 mg by mouth daily. 04/16/21  Yes [provider]    Family History Family History  Problem Relation Age of Onset   Scoliosis Mother    Other Mother        Disgenerative disc disease   Breast cancer Maternal Grandmother 56   Melanoma Maternal Grandmother        Malignant   Cancer Maternal Grandmother 60       has contact   Pancreatic cancer Other     Social History Social History   Tobacco Use   Smoking status: Never    Passive exposure: Yes   Smokeless tobacco: Never  Vaping Use   Vaping Use: Never used  Substance Use Topics   Alcohol use: No   Drug use: No     Allergies   Mangifera indica   Review of Systems Review of Systems  Constitutional:  Positive for fever.  HENT:  Positive for congestion and sore throat.   Respiratory:  Positive for shortness of breath.   Neurological:  Positive for headaches.  All other systems reviewed and are negative.    Physical Exam Triage Vital Signs ED Triage Vitals  Enc Vitals Group  BP 04/26/22 0930 121/80     Pulse Rate 04/26/22 0930 (!) 106     Resp 04/26/22 0930 18     Temp 04/26/22 0930 99.6 F (37.6 C)     Temp Source 04/26/22 0930 Oral     SpO2 04/26/22 0930 97 %     Weight 04/26/22 0928 200 lb (90.7 kg)     Height 04/26/22 0928 5\' 3"  (1.6 m)     Head Circumference --      Peak Flow --      Pain Score 04/26/22 0928 7     Pain Loc --      Pain Edu? --      Excl. in [place]? --    No data found.  Updated Vital Signs BP 121/80 (BP Location: Left Arm)   Pulse (!) 106   Temp 99.6 F (37.6 C) (Oral)   Resp 18   Ht 5\' 3"  (1.6 m)   Wt 200 lb (90.7 kg)   SpO2 97%   BMI 35.43 kg/m   Visual Acuity Right Eye Distance:   Left Eye Distance:   Bilateral Distance:    Right Eye Near:   Left Eye Near:    Bilateral Near:     Physical Exam Vitals and nursing note reviewed.  Constitutional:      General: She is not in acute distress.    Appearance: She is well-developed.  HENT:      Head: Normocephalic.     Right Ear: Tympanic membrane is retracted.     Left Ear: Tympanic membrane is retracted.     Nose: Congestion present.     Mouth/Throat:     Lips: Pink.     Mouth: Mucous membranes are moist.     Pharynx: Oropharynx is clear.  Eyes:     General: Lids are normal.     Conjunctiva/sclera: Conjunctivae normal.     Pupils: Pupils are equal, round, and reactive to light.  Neck:     Trachea: No tracheal deviation.  Cardiovascular:     Rate and Rhythm: Regular rhythm. Tachycardia present.     Pulses: Normal pulses.     Heart sounds: Normal heart sounds. No murmur heard.    Comments: Temp 99.6 Pulmonary:     Effort: Pulmonary effort is normal.     Breath sounds: Normal breath sounds and air entry.     Comments: No wheezing,no stridor, no rhonchi sat 97% on room air. Abdominal:     General: Bowel sounds are normal.     Palpations: Abdomen is soft.     Tenderness: There is no abdominal tenderness.  Musculoskeletal:        General: Normal range of motion.     Cervical back: Normal range of motion.  Lymphadenopathy:     Cervical: No cervical adenopathy.  Skin:    General: Skin is warm and dry.     Findings: No rash.  Neurological:     General: No focal deficit present.     Mental Status: She is alert and oriented to person, place, and time.     GCS: GCS eye subscore is 4. GCS verbal subscore is 5. GCS motor subscore is 6.  Psychiatric:        Speech: Speech normal.        Behavior: Behavior normal. Behavior is cooperative.      UC Treatments / Results  Labs (all labs ordered are listed, but only abnormal results are displayed) Labs Reviewed  RESP PANEL  BY RT-PCR (FLU A&B, COVID) [id number]  GROUP A STREP BY PCR    EKG   Radiology No results found.  Procedures Procedures (including critical care time)  Medications Ordered in UC Medications  ibuprofen (ADVIL) tablet 600 mg (600 mg Oral Given 04/26/22 0941)    Initial Impression /  Assessment and Plan / UC Course  I have reviewed the triage vital signs and the nursing notes.  Pertinent labs & imaging results that were available during my care of the patient were reviewed by me and considered in my medical decision making (see chart for details).     Ddx: Viral URI, allergies Final Clinical Impressions(s) / UC Diagnoses   Final diagnoses:  Viral URI     Discharge Instructions      Your strep and covid are negative. Rest,push fluids, may alternate tylenol/ibuprofen as label directed. Chloraseptic throat lozenges while awake. Follow up with PCP.    ED Prescriptions   None    PDMP not reviewed this encounter.   Clancy Gourd, NP 04/26/22 1036    Clancy Gourd, NP 04/26/22 1040

## 2022-04-27 ENCOUNTER — Other Ambulatory Visit: Payer: Self-pay

## 2022-04-27 DIAGNOSIS — Z20822 Contact with and (suspected) exposure to covid-19: Secondary | ICD-10-CM | POA: Insufficient documentation

## 2022-04-27 DIAGNOSIS — N9489 Other specified conditions associated with female genital organs and menstrual cycle: Secondary | ICD-10-CM | POA: Insufficient documentation

## 2022-04-27 DIAGNOSIS — J039 Acute tonsillitis, unspecified: Secondary | ICD-10-CM | POA: Diagnosis not present

## 2022-04-27 DIAGNOSIS — J029 Acute pharyngitis, unspecified: Secondary | ICD-10-CM | POA: Diagnosis present

## 2022-04-27 NOTE — ED Triage Notes (Signed)
Pt states she was seen at urgent care for sore throat, was strep, covid and flu negative, pt states continues to have sore throat, and difficulty swallowing. Pt without muffled voice when speaks clearly. Pt with white patch noted to left posterior throat, no tonsils present.

## 2022-04-28 ENCOUNTER — Emergency Department: Payer: BC Managed Care – PPO

## 2022-04-28 ENCOUNTER — Emergency Department
Admission: EM | Admit: 2022-04-28 | Discharge: 2022-04-28 | Disposition: A | Discharge status: Home / Self Care | Payer: BC Managed Care – PPO | Attending: Emergency Medicine | Admitting: Emergency Medicine

## 2022-04-28 DIAGNOSIS — J039 Acute tonsillitis, unspecified: Secondary | ICD-10-CM

## 2022-04-28 LAB — CBC WITH DIFFERENTIAL/PLATELET
Abs Immature Granulocytes: 0.07 10*3/uL (ref 0.00–0.07)
Basophils Absolute: 0.1 10*3/uL (ref 0.0–0.1)
Basophils Relative: 1 %
Eosinophils Absolute: 0.1 10*3/uL (ref 0.0–0.5)
Eosinophils Relative: 0 %
HCT: 41.2 % (ref 36.0–46.0)
Hemoglobin: 13.2 g/dL (ref 12.0–15.0)
Immature Granulocytes: 0 %
Lymphocytes Relative: 13 %
Lymphs Abs: 2.8 10*3/uL (ref 0.7–4.0)
MCH: 28.4 pg (ref 26.0–34.0)
MCHC: 32 g/dL (ref 30.0–36.0)
MCV: 88.6 fL (ref 80.0–100.0)
Monocytes Absolute: 1.6 10*3/uL — ABNORMAL HIGH (ref 0.1–1.0)
Monocytes Relative: 8 %
Neutro Abs: 16.3 10*3/uL — ABNORMAL HIGH (ref 1.7–7.7)
Neutrophils Relative %: 78 %
Platelets: 404 10*3/uL — ABNORMAL HIGH (ref 150–400)
RBC: 4.65 MIL/uL (ref 3.87–5.11)
RDW: 12.5 % (ref 11.5–15.5)
WBC: 20.9 10*3/uL — ABNORMAL HIGH (ref 4.0–10.5)
nRBC: 0 % (ref 0.0–0.2)

## 2022-04-28 LAB — BASIC METABOLIC PANEL
Anion gap: 9 (ref 5–15)
BUN: 9 mg/dL (ref 6–20)
CO2: 18 mmol/L — ABNORMAL LOW (ref 22–32)
Calcium: 9.3 mg/dL (ref 8.9–10.3)
Chloride: 111 mmol/L (ref 98–111)
Creatinine, Ser: 0.77 mg/dL (ref 0.44–1.00)
GFR, Estimated: >60 mL/min (ref >60)
Glucose, Bld: 98 mg/dL (ref 70–99)
Potassium: 3.8 mmol/L (ref 3.5–5.1)
Sodium: 138 mmol/L (ref 135–145)

## 2022-04-28 LAB — MONONUCLEOSIS SCREEN: Mono Screen: NEGATIVE

## 2022-04-28 LAB — RESP PANEL BY RT-PCR (FLU A&B, COVID) ARPGX2
Influenza A by PCR: NEGATIVE
Influenza B by PCR: NEGATIVE
SARS Coronavirus 2 by RT PCR: NEGATIVE

## 2022-04-28 LAB — GROUP A STREP BY PCR: Group A Strep by PCR: NOT DETECTED

## 2022-04-28 LAB — HCG, QUANTITATIVE, PREGNANCY: hCG, Beta Chain, Quant, S: <1 m[IU]/mL (ref <5)

## 2022-04-28 MED ORDER — IBUPROFEN 800 MG PO TABS: 800.0000 mg | ORAL_TABLET | Freq: Three times a day (TID) | ORAL | 0 refills | Status: AC | PRN

## 2022-04-28 MED ORDER — KETOROLAC TROMETHAMINE 30 MG/ML IJ SOLN
60.0000 mg | Freq: Once | INTRAMUSCULAR | Status: AC
  Administered 2022-04-28: 60 mg via INTRAMUSCULAR
  Filled 2022-04-28: qty 2

## 2022-04-28 MED ORDER — DEXAMETHASONE SODIUM PHOSPHATE 10 MG/ML IJ SOLN
10.0000 mg | Freq: Once | INTRAMUSCULAR | Status: AC
  Administered 2022-04-28: 10 mg via INTRAVENOUS
  Filled 2022-04-28: qty 1

## 2022-04-28 MED ORDER — IOHEXOL 300 MG/ML  SOLN
75.0000 mL | Freq: Once | INTRAMUSCULAR | Status: AC | PRN
  Administered 2022-04-28: 75 mL via INTRAVENOUS

## 2022-04-28 MED ORDER — AMOXICILLIN-POT CLAVULANATE 250-62.5 MG/5ML PO SUSR: 250.0000 mg | Freq: Two times a day (BID) | ORAL | 0 refills | Status: AC

## 2022-04-28 MED ORDER — LIDOCAINE VISCOUS HCL 2 % MT SOLN
15.0000 mL | Freq: Once | OROMUCOSAL | Status: AC
  Administered 2022-04-28: 15 mL via OROMUCOSAL
  Filled 2022-04-28: qty 15

## 2022-04-28 MED ORDER — LIDOCAINE VISCOUS HCL 2 % MT SOLN: 15.0000 mL | OROMUCOSAL | 1 refills | Status: DC | PRN

## 2022-04-28 MED ORDER — SODIUM CHLORIDE 0.9 % IV BOLUS (SEPSIS)
1000.0000 mL | Freq: Once | INTRAVENOUS | Status: AC
  Administered 2022-04-28: 1000 mL via INTRAVENOUS

## 2022-04-28 MED ORDER — SODIUM CHLORIDE 0.9 % IV SOLN
3.0000 g | Freq: Once | INTRAVENOUS | Status: AC
  Administered 2022-04-28: 3 g via INTRAVENOUS
  Filled 2022-04-28: qty 8

## 2022-04-28 MED ORDER — IBUPROFEN 800 MG PO TABS: 800.0000 mg | ORAL_TABLET | Freq: Once | ORAL | Status: DC

## 2022-04-28 NOTE — Discharge Instructions (Addendum)
You may alternate Tylenol 1000 mg every 6 hours as needed for pain, fever and Ibuprofen 800 mg every 6-8 hours as needed for pain, fever.  Please take Ibuprofen with food.  Do not take more than 4000 mg of Tylenol (acetaminophen) in a 24 hour period.  Steps to find a Primary Care Provider (PCP):  Call [phone] or [phone] to access "[facility] Find a Doctor Service."  2.  You may also go on the [place] website at [email or web address]  

## 2022-04-28 NOTE — ED Provider Notes (Signed)
[facility] Provider Note    Event Date/Time   First MD Initiated Contact with Patient 04/28/22 [id number]     (approximate)   History   Dysphagia   HPI  Maria Buckley is a 21 y.o. female with history of migraines who presents to the emergency department complaints of sore throat since Thursday.  No fevers, cough.  Has had previous tonsillectomy.  States she is having hard time talking and swallowing.  States she was recently around someone with viral pneumonia.   History provided by patient and significant other.    Past Medical History:  Diagnosis Date   Collapsed lung    S/P appendectomy 06/2018   Tachycardia     Past Surgical History:  Procedure Laterality Date   APPENDECTOMY  06/2018   GANGLION CYST EXCISION Right 11/22/2021   LAPAROSCOPIC APPENDECTOMY N/A 06/20/2018   Procedure: APPENDECTOMY LAPAROSCOPIC;  Surgeon: Benjamine Sprague, DO;  Location: [facility] ORS;  Service: General;  Laterality: N/A;   TENDON REPAIR Right 11/22/2021   TONSILLECTOMY      MEDICATIONS:  Prior to Admission medications   Medication Sig Start Date End Date Taking? Authorizing Provider  Galcanezumab-gnlm [facility]) Inject into the skin.    [provider]  ketorolac (TORADOL) 10 MG tablet Take 1 tablet (10 mg total) by mouth every 6 (six) hours as needed. 04/03/22   Harvest Dark, MD  levonorgestrel (MIRENA) 20 MCG/24HR IUD 1 each by Intrauterine route once.    [provider]  montelukast (SINGULAIR) 10 MG tablet Take 10 mg by mouth daily. 09/08/18   [provider]  nebivolol (BYSTOLIC) 2.5 MG tablet Take 2.5 mg by mouth daily. 04/16/21   [provider]    Physical Exam   Triage Vital Signs: ED Triage Vitals [04/27/22 2340]  Enc Vitals Group     BP (!) 141/107     Pulse Rate (!) 104     Resp 16     Temp 99.4 F (37.4 C)     Temp src      SpO2 99 %     Weight 200 lb (90.7 kg)     Height 5\' 3"  (1.6 m)     Head  Circumference      Peak Flow      Pain Score      Pain Loc      Pain Edu?      Excl. in [place]?     Most recent vital signs: Vitals:   04/27/22 2340 04/28/22 0503  BP: (!) 141/107 136/74  Pulse: (!) 104 (!) 105  Resp: 16 18  Temp: 99.4 F (37.4 C) 98.7 F (37.1 C)  SpO2: 99% 98%    CONSTITUTIONAL: Alert and oriented and responds appropriately to questions.  Appears uncomfortable HEAD: Normocephalic, atraumatic EYES: Conjunctivae clear, pupils appear equal, sclera nonicteric ENT: normal nose; moist mucous membranes, patient is status post tonsillectomy, she has posterior pharyngeal erythema without exudate, uvular deviation.  She is speaking in a whisper but no muffled voice or otherwise abnormal phonation.  She is swallowing her secretions but appears to have discomfort with doing so.  No trismus or drooling.  Airway is patent. NECK: Supple, normal ROM, no cervical lymphadenopathy, no meningismus CARD: Regular and tachycardic; S1 and S2 appreciated; no murmurs, no clicks, no rubs, no gallops RESP: Normal chest excursion without splinting or tachypnea; breath sounds clear and equal bilaterally; no wheezes, no rhonchi, no rales, no hypoxia or respiratory distress, speaking full sentences  ABD/GI: Normal bowel sounds; non-distended; soft, non-tender, no rebound, no guarding, no peritoneal signs BACK: The back appears normal EXT: Normal ROM in all joints; no deformity noted, no edema; no cyanosis SKIN: Normal color for age and race; warm; no rash on exposed skin NEURO: Moves all extremities equally, normal speech PSYCH: The patient's mood and manner are appropriate.   ED Results / Procedures / Treatments   LABS: (all labs ordered are listed, but only abnormal results are displayed) Labs Reviewed  CBC WITH DIFFERENTIAL/PLATELET - Abnormal; Notable for the following components:      Result Value   WBC 20.9 (*)    Platelets 404 (*)    Neutro Abs 16.3 (*)    Monocytes Absolute 1.6 (*)     All other components within normal limits  BASIC METABOLIC PANEL - Abnormal; Notable for the following components:   CO2 18 (*)    All other components within normal limits  GROUP A STREP BY PCR  RESP PANEL BY RT-PCR (FLU A&B, COVID) ARPGX2  HCG, QUANTITATIVE, PREGNANCY  MONONUCLEOSIS SCREEN     EKG:  RADIOLOGY: My personal review and interpretation of imaging: CT scan shows inflammation of her adenoids and lingual tonsils.  I have personally reviewed all radiology reports.   CT Soft Tissue Neck W Contrast  Result Date: 04/28/2022 CLINICAL DATA:  22 year old female with sore throat and difficulty swallowing. EXAM: CT NECK WITH CONTRAST TECHNIQUE: Multidetector CT imaging of the neck was performed using the standard protocol following the bolus administration of intravenous contrast. RADIATION DOSE REDUCTION: This exam was performed according to the departmental dose-optimization program which includes automated exposure control, adjustment of the mA and/or kV according to patient size and/or use of iterative reconstruction technique. CONTRAST:  81mL OMNIPAQUE IOHEXOL 300 MG/ML  SOLN COMPARISON:  CTA neck 06/18/2018. FINDINGS: Pharynx and larynx: Adenoid hypertrophy not significantly changed compared to 2019, aside from possible mild hyperenhancement now. Bulky lingual tonsil hypertrophy is symmetric and also similar to 2019 except perhaps for hyperenhancement now (series 2, image 65). Palatine tonsils are more diminutive or absent. Normal parapharyngeal spaces. Retropharyngeal space remains within normal limits. The epiglottis is normal except for ventral effacement by the lingual tonsil. Hypopharynx mildly distended with gas. Remaining larynx normal. Salivary glands: Negative.  Negative sublingual space. Thyroid: Negative. Lymph nodes: Left greater than right mildly enlarged, reactive appearing cervical lymph nodes. The largest node at the left level 2A station is 14 mm short axis. Primarily  subcentimeter lymph nodes otherwise but increased compared to 2019. No cystic or necrotic lymph nodes. Vascular: Major vascular structures in the neck and at the skull base are patent. 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 The left carotid also bifurcates early along the top of the thyroid level. Limited intracranial: Negative. Visualized orbits: Negative. Mastoids and visualized paranasal sinuses: Clear except for minor mucosal thickening at the left maxillary alveolar recess. Tympanic cavities and mastoids are clear. Skeleton: No acute dental finding or No acute osseous abnormality identified. Upper chest: Negative. Normal subglottic trachea. Residual thymus in the anterior superior mediastinum. Upper lungs are clear. IMPRESSION: Suspicion of URI and Tonsillitis affecting the lingual tonsils and adenoids, with reactive appearing cervical lymph nodes. But no tonsillar or neck abscess.  And normal epiglottis and larynx. Electronically Signed   By: Odessa Fleming M.D.   On: 04/28/2022 06:23     PROCEDURES:  Critical Care performed: No     Procedures    IMPRESSION / MDM / ASSESSMENT AND PLAN / ED COURSE  I reviewed the triage vital signs and the nursing notes.    Patient here with complaints of sore throat since Thursday.  No fevers or cough.    DIFFERENTIAL DIAGNOSIS (includes but not limited to):   Viral pharyngitis, deep space neck infection, no signs of uvulitis.  Doubt abscess given patient has had a previous tonsillectomy.   Patient's presentation is most consistent with acute presentation with potential threat to life or bodily function.   PLAN: We will obtain CBC, BMP, hCG, Monospot, COVID and flu swabs.  Strep swab was already obtained from triage and is negative.  Patient is already been given IM Toradol and viscous lidocaine and continues to complain of  significant pain.  We will give IV fluids, Decadron and obtain CT of the soft tissues of the neck.   MEDICATIONS GIVEN IN ED: Medications  Ampicillin-Sulbactam (UNASYN) 3 g in sodium chloride 0.9 % 100 mL IVPB (3 g Intravenous New Bag/Given 04/28/22 0639)  ketorolac (TORADOL) 30 MG/ML injection 60 mg (60 mg Intramuscular Given 04/28/22 0351)  lidocaine (XYLOCAINE) 2 % viscous mouth solution 15 mL (15 mLs Mouth/Throat Given 04/28/22 0353)  dexamethasone (DECADRON) injection 10 mg (10 mg Intravenous Given 04/28/22 0531)  sodium chloride 0.9 % bolus 1,000 mL (1,000 mLs Intravenous New Bag/Given 04/28/22 0531)  iohexol (OMNIPAQUE) 300 MG/ML solution 75 mL (75 mLs Intravenous Contrast Given 04/28/22 0604)     ED COURSE: Patient's labs show leukocytosis of 20,000.  COVID, flu negative.  CT scan reviewed and interpreted by myself and the radiologist and is concerning for lingular tonsillitis and inflammation of the adenoids.  No abscess noted.  Normal epiglottis.  We will start her on Unasyn and discharged with Augmentin.  Discussed supportive care instructions and return precautions.   At this time, I do not feel there is any life-threatening condition present. I reviewed all nursing notes, vitals, pertinent previous records.  All lab and urine results, EKGs, imaging ordered have been independently reviewed and interpreted by myself.  I reviewed all available radiology reports from any imaging ordered this visit.  Based on my assessment, I feel the patient is safe to be discharged home without further emergent workup and can continue workup as an outpatient as needed. Discussed all findings, treatment plan as well as usual and customary return precautions.  They verbalize understanding and are comfortable with this plan.  Outpatient follow-up has been provided as needed.  All questions have been answered.   CONSULTS: No admission at this time.  Patient is well-appearing, nontoxic, has normal  phonation currently and is swallowing her own secretions.  She is appropriate for outpatient management.   OUTSIDE RECORDS REVIEWED: Reviewed patient's last neurology note on 03/06/2022.       FINAL CLINICAL IMPRESSION(S) / ED DIAGNOSES   Final diagnoses:  Tonsillitis     Rx / DC Orders   ED Discharge Orders          Ordered    amoxicillin-clavulanate (AUGMENTIN) 250-62.5 MG/5ML suspension  2 times daily        04/28/22 0628    lidocaine (XYLOCAINE) 2 % solution  Every 4 hours PRN        04/28/22 0628    ibuprofen (ADVIL) 800 MG tablet  Every 8 hours PRN        04/28/22 0629  Note:  This document was prepared using Dragon voice recognition software and may include unintentional dictation errors.   Elmo Shumard, Layla Maw, DO 04/28/22 [id number]

## 2022-05-12 ENCOUNTER — Emergency Department
Admission: EM | Admit: 2022-05-12 | Discharge: 2022-05-12 | Discharge status: Against Medical Advice | Payer: BC Managed Care – PPO

## 2022-05-12 NOTE — ED Notes (Signed)
Pt called to be triaged at this time with no answer.

## 2023-05-20 ENCOUNTER — Encounter: Payer: Self-pay | Admitting: Obstetrics and Gynecology

## 2023-07-23 ENCOUNTER — Other Ambulatory Visit: Payer: Self-pay

## 2023-07-23 ENCOUNTER — Emergency Department: Payer: BC Managed Care – PPO

## 2023-07-23 ENCOUNTER — Emergency Department
Admission: EM | Admit: 2023-07-23 | Discharge: 2023-07-23 | Disposition: A | Discharge status: Home / Self Care | Payer: BC Managed Care – PPO | Attending: Emergency Medicine | Admitting: Emergency Medicine

## 2023-07-23 ENCOUNTER — Encounter: Payer: Self-pay | Admitting: Emergency Medicine

## 2023-07-23 DIAGNOSIS — X58XXXA Exposure to other specified factors, initial encounter: Secondary | ICD-10-CM | POA: Insufficient documentation

## 2023-07-23 DIAGNOSIS — T148XXA Other injury of unspecified body region, initial encounter: Secondary | ICD-10-CM

## 2023-07-23 DIAGNOSIS — S86911A Strain of unspecified muscle(s) and tendon(s) at lower leg level, right leg, initial encounter: Secondary | ICD-10-CM | POA: Diagnosis not present

## 2023-07-23 DIAGNOSIS — M79604 Pain in right leg: Secondary | ICD-10-CM | POA: Insufficient documentation

## 2023-07-23 DIAGNOSIS — S8991XA Unspecified injury of right lower leg, initial encounter: Secondary | ICD-10-CM | POA: Diagnosis present

## 2023-07-23 MED ORDER — CYCLOBENZAPRINE HCL 10 MG PO TABS: 10.0000 mg | ORAL_TABLET | Freq: Three times a day (TID) | ORAL | 0 refills | Status: AC | PRN

## 2023-07-23 NOTE — ED Triage Notes (Signed)
 Patient to ED via POV for right upper thigh pain. Described has burning pain in thigh that started this AM. PT states it was warm to touch. Sent by PCP for DVT r/o.

## 2023-07-23 NOTE — Discharge Instructions (Addendum)
 Have been diagnosed with muscle strain.  Please take Flexeril as directed.  You can apply warm compresses for 20 minutes.  You can apply Voltaren gel at night.

## 2023-07-23 NOTE — ED Provider Notes (Signed)
 [facility] Provider Note    Event Date/Time   First MD Initiated Contact with Patient 07/23/23 1825     (approximate)   History   Leg Pain   HPI  Maria Buckley is a 23 y.o. female who presents today with pain in her right tight that is started this morning.  Patient states he usually has these episodes that go away by themselves.  Patient was referred by the PCP to rule out DVT.      Physical Exam   Triage Vital Signs: ED Triage Vitals  Encounter Vitals Group     BP 07/23/23 1626 (!) 135/90     Systolic BP Percentile --      Diastolic BP Percentile --      Pulse Rate 07/23/23 1626 (!) 113     Resp 07/23/23 1626 18     Temp 07/23/23 1626 98.6 F (37 C)     Temp Source 07/23/23 1626 Oral     SpO2 07/23/23 1626 100 %     Weight 07/23/23 1627 195 lb (88.5 kg)     Height 07/23/23 1627 5' 3 (1.6 m)     Head Circumference --      Peak Flow --      Pain Score 07/23/23 1627 3     Pain Loc --      Pain Education --      Exclude from Growth Chart --     Most recent vital signs: Vitals:   07/23/23 1626  BP: (!) 135/90  Pulse: (!) 113  Resp: 18  Temp: 98.6 F (37 C)  SpO2: 100%     Constitutional: Alert distress Eyes: Conjunctivae are normal.  Head: Atraumatic. Nose: No congestion/rhinnorhea. Mouth/Throat: Mucous membranes are moist.   Neck: Painless ROM.  Cardiovascular:   Good peripheral circulation. Respiratory: Normal respiratory effort.  No retractions.  Gastrointestinal: Soft and nontender.  Musculoskeletal:  no deformity right tight leg: Skin intact, nontender to palpation, no erythema, no rash, no masses Neurologic:  MAE spontaneously. No gross focal neurologic deficits are appreciated.  Skin:  Skin is warm, dry and intact. No rash noted. Psychiatric: Mood and affect are normal. Speech and behavior are normal.    ED Results / Procedures / Treatments   Labs (all labs ordered are listed, but only abnormal results are  displayed) Labs Reviewed - No data to display   EKG     RADIOLOGY I independently reviewed and interpreted imaging and agree with radiologists findings.      PROCEDURES:  Critical Care performed:   Procedures   MEDICATIONS ORDERED IN ED: Medications - No data to display   IMPRESSION / MDM / ASSESSMENT AND PLAN / ED COURSE  I reviewed the triage vital signs and the nursing notes.  Differential diagnosis includes, but is not limited to, DVT, cellulitis, muscle strain  Patient's presentation is most consistent with acute complicated illness / injury requiring diagnostic workup.   Patient's diagnosis is consistent with muscle strain. I independently reviewed and interpreted imaging and agree with radiologists findings. Labs are rea reassuring. I did review the patient's allergies and medications. Patient will be discharged home with prescriptions for Flexeril . Patient is to follow up with PCP as needed or otherwise directed. Patient is given ED precautions to return to the ED for any worsening or new symptoms. Discussed plan of care with patient, answered all of patient's questions, Patient agreeable to plan of care. Advised patient to take medications according to  the instructions on the label. Discussed possible side effects of new medications. Patient verbalized understanding.     FINAL CLINICAL IMPRESSION(S) / ED DIAGNOSES   Final diagnoses:  Right leg pain  Muscle strain     Rx / DC Orders   ED Discharge Orders          Ordered    cyclobenzaprine  (FLEXERIL ) 10 MG tablet  3 times daily PRN        07/23/23 1914             Note:  This document was prepared using Dragon voice recognition software and may include unintentional dictation errors.   Janit Kast, PA-C 07/23/23 1916    Willo Dunnings, MD 07/23/23 2259

## 2023-08-01 ENCOUNTER — Other Ambulatory Visit: Payer: Self-pay

## 2023-08-01 ENCOUNTER — Emergency Department: Payer: BC Managed Care – PPO

## 2023-08-01 ENCOUNTER — Emergency Department
Admission: EM | Admit: 2023-08-01 | Discharge: 2023-08-01 | Disposition: A | Discharge status: Home / Self Care | Payer: BC Managed Care – PPO | Attending: Emergency Medicine | Admitting: Emergency Medicine

## 2023-08-01 DIAGNOSIS — H539 Unspecified visual disturbance: Secondary | ICD-10-CM

## 2023-08-01 DIAGNOSIS — R079 Chest pain, unspecified: Secondary | ICD-10-CM | POA: Insufficient documentation

## 2023-08-01 DIAGNOSIS — R000 Tachycardia, unspecified: Secondary | ICD-10-CM | POA: Insufficient documentation

## 2023-08-01 DIAGNOSIS — H538 Other visual disturbances: Secondary | ICD-10-CM | POA: Insufficient documentation

## 2023-08-01 DIAGNOSIS — R0602 Shortness of breath: Secondary | ICD-10-CM | POA: Insufficient documentation

## 2023-08-01 LAB — CBC
HCT: 41.8 % (ref 36.0–46.0)
Hemoglobin: 14.1 g/dL (ref 12.0–15.0)
MCH: 29.3 pg (ref 26.0–34.0)
MCHC: 33.7 g/dL (ref 30.0–36.0)
MCV: 86.7 fL (ref 80.0–100.0)
Platelets: 381 10*3/uL (ref 150–400)
RBC: 4.82 MIL/uL (ref 3.87–5.11)
RDW: 13.3 % (ref 11.5–15.5)
WBC: 12.8 10*3/uL — ABNORMAL HIGH (ref 4.0–10.5)
nRBC: 0 % (ref 0.0–0.2)

## 2023-08-01 LAB — BASIC METABOLIC PANEL
Anion gap: 9 (ref 5–15)
BUN: 10 mg/dL (ref 6–20)
CO2: 19 mmol/L — ABNORMAL LOW (ref 22–32)
Calcium: 9 mg/dL (ref 8.9–10.3)
Chloride: 110 mmol/L (ref 98–111)
Creatinine, Ser: 0.6 mg/dL (ref 0.44–1.00)
GFR, Estimated: >60 mL/min (ref >60)
Glucose, Bld: 96 mg/dL (ref 70–99)
Potassium: 4 mmol/L (ref 3.5–5.1)
Sodium: 138 mmol/L (ref 135–145)

## 2023-08-01 MED ORDER — LACTATED RINGERS IV BOLUS
1000.0000 mL | Freq: Once | INTRAVENOUS | Status: AC
  Administered 2023-08-01: 1000 mL via INTRAVENOUS

## 2023-08-01 MED ORDER — ONDANSETRON HCL 4 MG/2ML IJ SOLN
4.0000 mg | Freq: Once | INTRAMUSCULAR | Status: AC
  Administered 2023-08-01: 4 mg via INTRAVENOUS
  Filled 2023-08-01: qty 2

## 2023-08-01 NOTE — ED Provider Notes (Signed)
[facility] Provider Note   Event Date/Time   First MD Initiated Contact with Patient 08/01/23 1155     (approximate) History  Blurred Vision, Shaking, Shortness of Breath, and Tachycardia  HPI Maria Buckley is a 23 y.o. female with stated past medical history of depression/anxiety, pots disease, migraines, and vasovagal syncope who presents complaining of tachycardia, chest pain, and shortness of breath since awakening this morning.  Patient states that these symptoms are similar to previous POTS symptoms that she has had before however states that they have never lasted this long.  Patient also endorses subjective blurred vision that has resolved since onset. ROS: Patient currently denies any tinnitus, difficulty speaking, facial droop, sore throat, abdominal pain, nausea/vomiting/diarrhea, dysuria, or weakness/numbness/paresthesias in any extremity   Physical Exam  Triage Vital Signs: ED Triage Vitals  Encounter Vitals Group     BP 08/01/23 1120 (!) 135/94     Systolic BP Percentile --      Diastolic BP Percentile --      Pulse Rate 08/01/23 1120 (!) 133     Resp 08/01/23 1120 (!) 22     Temp 08/01/23 1120 97.6 F (36.4 C)     Temp Source 08/01/23 1120 Oral     SpO2 08/01/23 1120 100 %     Weight 08/01/23 1121 195 lb (88.5 kg)     Height 08/01/23 1121 5\' 3"  (1.6 m)     Head Circumference --      Peak Flow --      Pain Score 08/01/23 1121 4     Pain Loc --      Pain Education --      Exclude from Growth Chart --    Most recent vital signs: Vitals:   08/01/23 1330 08/01/23 1400  BP: 93/60 108/66  Pulse: 91 99  Resp: (!) 23 20  Temp:    SpO2: 100% 100%   General: Awake, oriented x4. CV:  Good peripheral perfusion.  Resp:  Increased effort.  Abd:  No distention.  Other:  Anxious Young adult obese Caucasian female resting comfortably in no acute distress ED Results / Procedures / Treatments  Labs (all labs ordered are listed, but only  abnormal results are displayed) Labs Reviewed  BASIC METABOLIC PANEL - Abnormal; Notable for the following components:      Result Value   CO2 19 (*)    All other components within normal limits  CBC - Abnormal; Notable for the following components:   WBC 12.8 (*)    All other components within normal limits   EKG ED ECG REPORT I, Merwyn Katos, the attending physician, personally viewed and interpreted this ECG. Date: 08/01/2023 EKG Time: 1125 Rate: 113 Rhythm: Tachycardic sinus rhythm QRS Axis: normal Intervals: normal ST/T Wave abnormalities: normal Narrative Interpretation: Tachycardic sinus rhythm.  No evidence of acute ischemia RADIOLOGY ED MD interpretation: One-view portable chest x-ray interpreted by me shows no evidence of acute abnormalities including no pneumonia, pneumothorax, or widened mediastinum -Agree with radiology assessment Official radiology report(s): DG Chest Port 1 View Result Date: 08/01/2023 CLINICAL DATA:  Chest pain, shortness of breath and tachycardia. EXAM: PORTABLE CHEST 1 VIEW COMPARISON:  06/18/2018. FINDINGS: Normal heart, mediastinum and hila. Clear lungs.  No pleural effusion or pneumothorax. Skeletal structures are unremarkable. IMPRESSION: Normal portable chest radiograph. Electronically Signed   By: Amie Portland M.D.   On: 08/01/2023 12:59   PROCEDURES: Critical Care performed: No Procedures MEDICATIONS ORDERED IN ED: Medications  lactated ringers bolus 1,000 mL (0 mLs Intravenous Stopped 08/01/23 1419)  ondansetron (ZOFRAN) injection 4 mg (4 mg Intravenous Given 08/01/23 1252)   IMPRESSION / MDM / ASSESSMENT AND PLAN / ED COURSE  I reviewed the triage vital signs and the nursing notes.                             The patient is on the cardiac monitor to evaluate for evidence of arrhythmia and/or significant heart rate changes. Patient's presentation is most consistent with acute presentation with potential threat to life or bodily  function. 23 year old female presents with palpitations. EKG: No STEMI and no evidence of Brugada's sign, delta wave, epsilon wave, significantly prolonged QTc, or malignant arrhythmia. Based on H&P and testing, this patient appears to be low risk for emergent causes of palpitations such as, but not limited to, a malignant cardiac arrhythmia, ACS, pulmonary embolism, thyrotoxicosis, PNA, PTX.  The patient has been given strict return precautions and understands the need for further outpatient testing and treatment. Dispo: Discharge home with PCP and cardiology follow-up   FINAL CLINICAL IMPRESSION(S) / ED DIAGNOSES   Final diagnoses:  Tachycardia  Shortness of breath  Chest pain, unspecified type  Vision changes   Rx / DC Orders   ED Discharge Orders     None      Note:  This document was prepared using Dragon voice recognition software and may include unintentional dictation errors.   Merwyn Katos, MD 08/01/23 [phone]

## 2023-08-01 NOTE — ED Triage Notes (Signed)
Pt reports:  Blurry Vision Started today Shaking Started at 945 Fast Heart rate Started around 930 SOB Started around 945

## 2023-08-01 NOTE — ED Notes (Signed)
EKG completed per RN Grant Fontana

## 2023-12-25 ENCOUNTER — Other Ambulatory Visit: Payer: Self-pay

## 2023-12-25 ENCOUNTER — Emergency Department
Admission: EM | Admit: 2023-12-25 | Discharge: 2023-12-25 | Disposition: A | Discharge status: Home / Self Care | Attending: Emergency Medicine | Admitting: Emergency Medicine

## 2023-12-25 DIAGNOSIS — M79601 Pain in right arm: Secondary | ICD-10-CM | POA: Insufficient documentation

## 2023-12-25 DIAGNOSIS — M791 Myalgia, unspecified site: Secondary | ICD-10-CM | POA: Diagnosis present

## 2023-12-25 DIAGNOSIS — M25511 Pain in right shoulder: Secondary | ICD-10-CM | POA: Diagnosis not present

## 2023-12-25 DIAGNOSIS — D72829 Elevated white blood cell count, unspecified: Secondary | ICD-10-CM | POA: Insufficient documentation

## 2023-12-25 DIAGNOSIS — M542 Cervicalgia: Secondary | ICD-10-CM | POA: Diagnosis not present

## 2023-12-25 DIAGNOSIS — M25512 Pain in left shoulder: Secondary | ICD-10-CM | POA: Diagnosis not present

## 2023-12-25 DIAGNOSIS — M2550 Pain in unspecified joint: Secondary | ICD-10-CM

## 2023-12-25 DIAGNOSIS — M79602 Pain in left arm: Secondary | ICD-10-CM | POA: Diagnosis not present

## 2023-12-25 LAB — BASIC METABOLIC PANEL WITH GFR
Anion gap: 7 (ref 5–15)
BUN: 13 mg/dL (ref 6–20)
CO2: 24 mmol/L (ref 22–32)
Calcium: 9 mg/dL (ref 8.9–10.3)
Chloride: 106 mmol/L (ref 98–111)
Creatinine, Ser: 0.69 mg/dL (ref 0.44–1.00)
GFR, Estimated: >60 mL/min (ref >60)
Glucose, Bld: 122 mg/dL — ABNORMAL HIGH (ref 70–99)
Potassium: 4 mmol/L (ref 3.5–5.1)
Sodium: 137 mmol/L (ref 135–145)

## 2023-12-25 LAB — TECHNOLOGIST SMEAR REVIEW: Plt Morphology: NORMAL

## 2023-12-25 LAB — CBC WITH DIFFERENTIAL/PLATELET
Abs Immature Granulocytes: 0.03 10*3/uL (ref 0.00–0.07)
Basophils Absolute: 0.1 10*3/uL (ref 0.0–0.1)
Basophils Relative: 1 %
Eosinophils Absolute: 0.2 10*3/uL (ref 0.0–0.5)
Eosinophils Relative: 2 %
HCT: 40.7 % (ref 36.0–46.0)
Hemoglobin: 13.6 g/dL (ref 12.0–15.0)
Immature Granulocytes: 0 %
Lymphocytes Relative: 30 %
Lymphs Abs: 3.7 10*3/uL (ref 0.7–4.0)
MCH: 29.2 pg (ref 26.0–34.0)
MCHC: 33.4 g/dL (ref 30.0–36.0)
MCV: 87.5 fL (ref 80.0–100.0)
Monocytes Absolute: 0.8 10*3/uL (ref 0.1–1.0)
Monocytes Relative: 7 %
Neutro Abs: 7.7 10*3/uL (ref 1.7–7.7)
Neutrophils Relative %: 60 %
Platelets: 374 10*3/uL (ref 150–400)
RBC: 4.65 MIL/uL (ref 3.87–5.11)
RDW: 12.2 % (ref 11.5–15.5)
WBC: 12.5 10*3/uL — ABNORMAL HIGH (ref 4.0–10.5)
nRBC: 0 % (ref 0.0–0.2)

## 2023-12-25 LAB — CK: Total CK: 44 U/L (ref 38–234)

## 2023-12-25 LAB — RAPID HIV SCREEN (HIV 1/2 AB+AG)
HIV 1/2 Antibodies: NONREACTIVE
HIV-1 P24 Antigen - HIV24: NONREACTIVE

## 2023-12-25 LAB — POC URINE PREG, ED: Preg Test, Ur: NEGATIVE

## 2023-12-25 LAB — SEDIMENTATION RATE: Sed Rate: 6 mm/h (ref 0–20)

## 2023-12-25 MED ORDER — PREDNISONE 10 MG PO TABS: ORAL_TABLET | ORAL | 0 refills | Status: DC

## 2023-12-25 MED ORDER — KETOROLAC TROMETHAMINE 30 MG/ML IJ SOLN: 15.0000 mg | Freq: Once | INTRAMUSCULAR | Status: DC

## 2023-12-25 MED ORDER — PREDNISONE 20 MG PO TABS
60.0000 mg | ORAL_TABLET | Freq: Once | ORAL | Status: AC
  Administered 2023-12-25: 60 mg via ORAL
  Filled 2023-12-25: qty 3

## 2023-12-25 MED ORDER — KETOROLAC TROMETHAMINE 30 MG/ML IJ SOLN
15.0000 mg | Freq: Once | INTRAMUSCULAR | Status: AC
  Administered 2023-12-25: 15 mg via INTRAVENOUS
  Filled 2023-12-25: qty 1

## 2023-12-25 NOTE — ED Notes (Signed)
 Pt states she has bee having generalized pain for 3 weeks. States same just started randomly without any excess use or her muscles, falls or injuries.

## 2023-12-25 NOTE — ED Provider Notes (Signed)
-----------------------------------------   8:10 PM on 12/25/2023 -----------------------------------------  Blood pressure (!) 126/95, pulse (!) 104, temperature 98.5 F (36.9 C), temperature source Oral, resp. rate 16, weight 88.5 kg, SpO2 97%.  Assuming care from Dr. Valetta Gaudy.  In short, Maria Buckley is a 23 y.o. female with a chief complaint of No chief complaint on file. Aaron Aas  Refer to the original H&P for additional details.  The current plan of care is to awaiting metabolic panel.  ----------------------------------------- 9:05 PM on 12/25/2023 -----------------------------------------  Results updated to patient.  She is in stable condition for discharge home.  Gait is steady.  Encourage close follow-up with PCP for further evaluation.       Phyllis Breeze, Nusaybah Ivie A, PA-C 12/25/23 2105    Iver Marker, MD 12/30/23 2027

## 2023-12-25 NOTE — ED Triage Notes (Signed)
 C/O severe muscle and joint pain x 3 weeks. SEen by PCP, given steroids and finished coarse this morning. Seen through [facility] ED Sunday for same. Yesterday while at work, neck pain worsened and jaw pain. Presented again to [facility] ED and seen. Toradol  and muscle relaxers given  Pain woke patient this morning at 0930, burning in legs.    20 minutes ago c/o headache.

## 2023-12-25 NOTE — Discharge Instructions (Addendum)
 Please follow-up closely with Dr. Arlena Lacrosse. Your Lyme disease and [facility] spotted fever test are send outs and will be resulted in 3-4 days. You can check your mychart for these results. Please discuss with your PCP.  Return to the ER if you develop a fever, rash, notable worsening of pain, vomiting, difficulty breathing or other concerns or symptoms arise.

## 2023-12-25 NOTE — ED Provider Notes (Signed)
 [facility] Provider Note    Event Date/Time   First MD Initiated Contact with Patient 12/25/23 1834     (approximate)   History   Body aches and muscle aches  HPI  Maria Buckley is a 23 y.o. female reports for about close to a month now has been experiencing bodyaches.  She will have muscle aches in her arms backs's shoulders neck now for several weeks.  No measured fevers.  No chills no nausea no vomiting no other symptoms.  She has been seen by her primary care doctor twice, had autoimmune checks, was seen at the [facility] ER twice including yesterday.  She reports that no one has been able to clearly diagnose her because it is causing her pain when she walks, moves her muscles, the only thing that provided relief for her was being on steroid which she is no longer taking, but even then it only provided brief relief  She does not have any known autoimmune disorder.  She did travel to [place] in May, but reports that while on her way there she started to have a cough and cold but that resolved before she even return from a sealant.  She is not aware of any infections tick bites or insect bites or rashes.  She denies pregnancy.  Reports having a IUD and not having any discharge     Physical Exam   Triage Vital Signs: ED Triage Vitals  Encounter Vitals Group     BP 12/25/23 1822 (!) 126/95     Girls Systolic BP Percentile --      Girls Diastolic BP Percentile --      Boys Systolic BP Percentile --      Boys Diastolic BP Percentile --      Pulse Rate 12/25/23 1822 (!) 104     Resp 12/25/23 1822 16     Temp 12/25/23 1822 98.5 F (36.9 C)     Temp Source 12/25/23 1822 Oral     SpO2 12/25/23 1822 97 %     Weight 12/25/23 1823 195 lb 1.7 oz (88.5 kg)     Height --      Head Circumference --      Peak Flow --      Pain Score 12/25/23 1822 9     Pain Loc --      Pain Education --      Exclude from Growth Chart --     Most recent vital  signs: Vitals:   12/25/23 1822  BP: (!) 126/95  Pulse: (!) 104  Resp: 16  Temp: 98.5 F (36.9 C)  SpO2: 97%     General: Awake, no distress.  She appears normal.  She does not appear in acute pain or extremis but reports body aches.  Tenderness to palpation across the trapezius muscles as well as the calf muscles which is are mild.  There is no tenseness.  No compartments that seem firm.  She demonstrates normal movements and her joints do not appear to have any effusions in any of her major joints. Conjunctiva are normal and not injected.  Her mucous membranes are normal CV:  Good peripheral perfusion.  Normal tones and rate Resp:  Normal effort.  Clear lungs Abd:  No distention.  Soft and nontender Other:  Normal reflexes with no mild clonus lower extremities bilateral.  There is no rashes.  There is no meningismus.   ED Results / Procedures / Treatments   Labs (  all labs ordered are listed, but only abnormal results are displayed) Labs Reviewed  CBC WITH DIFFERENTIAL/PLATELET - Abnormal; Notable for the following components:      Result Value   WBC 12.5 (*)    All other components within normal limits  SEDIMENTATION RATE  ROCKY MTN SPOTTED FVR ABS PNL(IGG+IGM)  LYME DISEASE DNA BY PCR(BORRELIA BURG)  RAPID HIV SCREEN (HIV 1/2 AB+AG)  CK  BASIC METABOLIC PANEL WITH GFR  TECHNOLOGIST SMEAR REVIEW  POC URINE PREG, ED   Pending labs to be followed up by PA Phyllis Breeze, with exception that several of these labs may be send out for prolonged processing.   PROCEDURES:  Critical Care performed: No  Procedures   MEDICATIONS ORDERED IN ED: Medications  predniSONE (DELTASONE) tablet 60 mg (has no administration in time range)  ketorolac  (TORADOL ) 30 MG/ML injection 15 mg (has no administration in time range)     IMPRESSION / MDM / ASSESSMENT AND PLAN / ED COURSE  I reviewed the triage vital signs and the nursing notes.                              Differential  diagnosis includes, but is not limited to, myalgias, arthralgias, inflammatory conditions, less likely felt to be an obvious infectious or viral cause given the symptomatology and timing.  She does report travel [place].  Reviewed primary care notes including notes from Dr. Arlena Lacrosse where an extensive autoimmune panels have been completed.  She also had recent testing including CBC CRP etc. at [facility].  She has had normal CK.  Her clinical history supports some type of an ongoing myalgia but she is awake alert well-appearing without distress or extremis.  She has no obvious rashes, she has no notable headache at this time, meningismus, or other findings would be concerning for central neurologic cause etc.  She has no symptoms or time that would fit encephalitis.  At this juncture I think she would benefit from follow-up with a specialist.  I have made referral to rheumatology.  Patient was hoping to see a rheumatologist tonight, but discussed with her that we do not have an on-call rheumatologist today.  She is obviously been seeking care at the ER and doctors office trying to figure out the cause, but at this point I Duomed a loss but do not identify any clear emergency condition.  I think she would benefit from close follow-up.  Upon review and though she does not report any, I did send our rSMF and Lyme studies in addition check HIV pregnancy test and a few other tests which may be helpful excluding causes.  Patient agreeable to reinitiating steroid therapy and dose of Toradol .  Currently using Tylenol  and ibuprofen  with little effect.  Patient's presentation is most consistent with acute complicated illness / injury requiring diagnostic workup.  She has a very mild leukocytosis but this also appears to be chronic at our lab  She had extensive workup even last night at [facility] including CT of the head and abdomen and pelvis.  Additionally she had laboratory testing and ECG performed  there.   Discussed with patient, will reinitiate steroids, she will follow-up with her outpatient provider.  Discussed careful return precautions and that some of her test will be pending here.  She is understanding.  ----------------------------------------- 8:17 PM on 12/25/2023 ----------------------------------------- PA Phyllis Breeze to follow-up on remaining labs and disposition, anticipate likely discharge to home unless significant  abnormality found in the patient's metabolic panel or CK.  Patient is aware of recommendation to follow-up closely with outpatient rheumatology, close follow-up with outpatient provider, and return precautions.  Steroid taper provided.      FINAL CLINICAL IMPRESSION(S) / ED DIAGNOSES   Final diagnoses:  Myalgia  Arthralgia, unspecified joint     Rx / DC Orders   ED Discharge Orders          Ordered    Ambulatory referral to Rheumatology       Comments: Myalgias, diffuse   12/25/23 1854    predniSONE (DELTASONE) 10 MG tablet        12/25/23 2015             Note:  This document was prepared using Dragon voice recognition software and may include unintentional dictation errors.   Iver Marker, MD 12/25/23 2017

## 2023-12-30 ENCOUNTER — Other Ambulatory Visit: Payer: Self-pay | Admitting: Family Medicine

## 2023-12-30 DIAGNOSIS — E069 Thyroiditis, unspecified: Secondary | ICD-10-CM

## 2023-12-30 LAB — LYME DISEASE DNA BY PCR(BORRELIA BURG): Lyme Disease(B.burgdorferi)PCR: NEGATIVE

## 2024-01-01 ENCOUNTER — Ambulatory Visit
Admission: RE | Admit: 2024-01-01 | Discharge: 2024-01-01 | Disposition: A | Discharge status: Home / Self Care | Source: Ambulatory Visit | Attending: Family Medicine | Admitting: Family Medicine

## 2024-01-01 DIAGNOSIS — E069 Thyroiditis, unspecified: Secondary | ICD-10-CM | POA: Diagnosis present

## 2024-03-24 NOTE — ED Provider Notes (Signed)
 ------------------------------------------------------------------------------- Attestation signed by Aloha Theo Furth, MD at 03/26/24 [phone] Patient encounter was completed by the APP listed on this note. I was not directly involved in the care of this patient; however, I was present in the department and available to offer assistance at any moment if requested. -------------------------------------------------------------------------------   [facility]  ED Provider Note  Maria Buckley 23 y.o. female DOB: March 19, 2001 MRN: [id number] History   Chief Complaint  Patient presents with  . Chest Pain    Pt reports CP and SOB since Saturday - was seen at [facility] ER on Saturday and discharged - Pt spoke to PCP about meds for anxiety and PCP sent her to be evaluated again.   23 year old female with past medical history of depression and anxiety, POTS, hypertension, presents to the emergency department for evaluation of intermittent chest pain and shortness of breath since Saturday.  Patient reports that symptoms occur while at rest or while driving last anywhere between 3 and 5 minutes.  Patient reports symptoms resolve on their own.  Patient reports that on Saturday when symptoms started she presented to an outside emergency department where she had a full cardiac and pulmonary evaluation.  Patient reports that she was told that her cardiac troponins were normal and her D-dimer was negative as well as normal basic labs and x-rays and was discharged home with recommendations to follow-up with her PCP.  Patient reports that she called her PCPs office today requesting medicine that she can take as needed for more acute symptoms of anxiety/panic attacks and described her symptoms to them which they then recommended she come to the emergency department again for further evaluation.  Patient reports currently not having any symptoms.  Patient takes fluoxetine for her anxiety  and depression daily.  Patient denies chest pain that radiates into her left arm or jaw, associated diaphoresis, nausea, epigastric abdominal pain.        History reviewed. No pertinent past medical history.  History reviewed. No pertinent surgical history.  Social History   Substance and Sexual Activity  Alcohol Use None   Tobacco Use History[1] E-Cigarettes  . Vaping Use    . Start Date    . Cartridges/Day    . Quit Date     Social History   Substance and Sexual Activity  Drug Use Not on file         Allergies[2]  Discharge Medication List as of 03/24/2024  5:40 PM      Primary Survey   Exposure   No visible chest trauma.        Review of Systems   Review of Systems  Constitutional:  Negative for chills and fever.  HENT:  Negative for ear pain and sore throat.   Eyes:  Negative for pain and visual disturbance.  Respiratory:  Positive for shortness of breath. Negative for cough.   Cardiovascular:  Positive for chest pain. Negative for palpitations.  Gastrointestinal:  Negative for abdominal pain and vomiting.  Genitourinary:  Negative for dysuria and hematuria.  Musculoskeletal:  Negative for arthralgias and back pain.  Skin:  Negative for color change and rash.  Neurological:  Negative for seizures and syncope.  Psychiatric/Behavioral:  The patient is nervous/anxious.   All other systems reviewed and are negative.   Physical Exam   ED Triage Vitals [03/24/24 1618]  BP 132/84  Heart Rate 75  Resp 17  SpO2 100 %  Temp 98.5 F (36.9 C)    Physical Exam  Nursing note and vitals reviewed. Constitutional: She appears well-developed and well-nourished. She does not appear distressed and does not appear ill.  HENT:  Head: Normocephalic and atraumatic.  Mouth/Throat: Voice normal.  Eyes: EOM are intact. Conjunctivae are normal. Pupils are equal, round, and reactive to light.  Neck: Normal range of motion and voice normal. Neck supple.   Cardiovascular: Normal rate, regular rhythm and normal heart sounds.  Pulmonary/Chest: Respiratory effort normal and breath sounds normal. No visible chest trauma.  Musculoskeletal: Normal range of motion.     Cervical back: Normal range of motion and neck supple. no edema.  Neurological: She is alert and oriented to person, place, and time. She has normal speech.  Skin: Skin is warm. Skin is dry.  Psychiatric: She has a normal mood and affect. Her behavior is normal. Judgment and thought content normal.     ED Course   Lab results: No data to display  Imaging:   XR CHEST AP PORTABLE   Narrative:    CLINICAL HISTORY: Chest Pain  COMPARISON: None.   TECHNIQUE: Portable chest  03/24/2024 4:23 PM  FINDINGS:  The cardiomediastinal silhouette is within normal limits. The lung fields are clear. No pneumothorax or effusion. Soft tissues including the upper abdomen appear normal.   Bones are intact.    Impression:    IMPRESSION: No acute abnormality.  Electronically Signed by: Grayce Linear, MD on 03/24/2024 5:00 PM     ECG: ECG Results          ECG 12 lead (Final result)  Result time 03/24/24 19:46:44    Final result             Narrative:   Diagnosis Class Normal Acquisition Device D3K Ventricular Rate 74 Atrial Rate 74 P-R Interval 146 QRS Duration 76 Q-T Interval 384 QTC Calculation(Bazett) 426 Calculated P Axis 62 Calculated R Axis 83 Calculated T Axis 70  Diagnosis Normal sinus rhythm Normal ECG No previous ECGs available Maria Buckley ([id number]) on 03/24/2024 7:46:41 PM certifies that he/she has reviewed the ECG tracing and confirms the  independent interpretation is correct.                            HEAR Score History: Mostly low risk features ECG: Normal Age: Less than 45 yrs Risk Factors: 1 or 2 risk factors HEAR Score Total: 1                                                            Pre-Sedation Procedures    Medical Decision Making Given patient's history and physical, the following studies were ordered. Orders Placed This Encounter     XR Chest Ap Portable     ECG 12 lead  Results were reviewed, unremarkable chest x-ray and no ischemic abnormalities on EKG.  Reviewed results of the evaluation performed at an outside emergency department on Saturday.  Patient had negative cardiac troponin, negative D-dimer, normal TSH, no electrolyte abnormalities, normal chest x-ray.  Patient denies any change in her symptoms since Saturday.  Patient was laying comfortably in her ED bed, nontoxic-appearing, no acute distress, speaking full sentences.  Patient had normal heart and lung sounds.  No lower extremity edema.  Patient remained hemodynamically stable and neurovascularly intact.  Patient  afebrile.  On hydroxyzine for more acute symptoms of anxiety.  Advised that she follow-up with her PCP for further management of her symptoms.  Patient has a follow-up with her cardiologist for her POTS.   Patient was discharged home in stable conditions.  Strict ED return precautions given.  Risk Prescription drug management.          Provider Communication  Discharge Medication List as of 03/24/2024  5:40 PM     START taking these medications   Details  hydrOXYzine HCl (ATARAX) 25 mg tablet Take one tablet (25 mg dose) by mouth every 6 (six) hours as needed for Anxiety., Starting Thu 03/24/2024, Normal        Discharge Medication List as of 03/24/2024  5:40 PM      Discharge Medication List as of 03/24/2024  5:40 PM      Clinical Impression Final diagnoses:  Atypical chest pain  Palpitations  Anxiety disorder with panic attacks    ED Disposition     ED Disposition  Discharge   Condition  Stable   Comment  --                 Follow-up Information     Schedule an appointment as soon as possible for a visit  with Mychal Powell.   Contact  information: [address] [place] [address] [phone]                  Electronically signed by:        [1] Social History Tobacco Use  Smoking Status Not on file  Smokeless Tobacco Not on file  [2] Not on File  Maria Buckley, Maria Buckley 03/24/24 2227

## 2024-03-31 ENCOUNTER — Other Ambulatory Visit: Payer: Self-pay

## 2024-03-31 ENCOUNTER — Ambulatory Visit
Admission: EM | Admit: 2024-03-31 | Discharge: 2024-03-31 | Disposition: A | Discharge status: Home / Self Care | Attending: Family Medicine | Admitting: Family Medicine

## 2024-03-31 ENCOUNTER — Emergency Department (HOSPITAL_BASED_OUTPATIENT_CLINIC_OR_DEPARTMENT_OTHER)
Admission: EM | Admit: 2024-03-31 | Discharge: 2024-03-31 | Discharge status: Against Medical Advice | Attending: Emergency Medicine | Admitting: Emergency Medicine

## 2024-03-31 ENCOUNTER — Encounter (HOSPITAL_BASED_OUTPATIENT_CLINIC_OR_DEPARTMENT_OTHER): Payer: Self-pay

## 2024-03-31 ENCOUNTER — Emergency Department (HOSPITAL_BASED_OUTPATIENT_CLINIC_OR_DEPARTMENT_OTHER)

## 2024-03-31 DIAGNOSIS — R0789 Other chest pain: Secondary | ICD-10-CM | POA: Diagnosis not present

## 2024-03-31 DIAGNOSIS — R112 Nausea with vomiting, unspecified: Secondary | ICD-10-CM | POA: Insufficient documentation

## 2024-03-31 DIAGNOSIS — R079 Chest pain, unspecified: Secondary | ICD-10-CM | POA: Insufficient documentation

## 2024-03-31 DIAGNOSIS — R0602 Shortness of breath: Secondary | ICD-10-CM | POA: Insufficient documentation

## 2024-03-31 DIAGNOSIS — Z5321 Procedure and treatment not carried out due to patient leaving prior to being seen by health care provider: Secondary | ICD-10-CM | POA: Diagnosis not present

## 2024-03-31 DIAGNOSIS — F419 Anxiety disorder, unspecified: Secondary | ICD-10-CM | POA: Diagnosis not present

## 2024-03-31 DIAGNOSIS — G894 Chronic pain syndrome: Secondary | ICD-10-CM | POA: Diagnosis not present

## 2024-03-31 DIAGNOSIS — M546 Pain in thoracic spine: Secondary | ICD-10-CM | POA: Diagnosis not present

## 2024-03-31 LAB — CBC
HCT: 40.7 % (ref 36.0–46.0)
Hemoglobin: 13.4 g/dL (ref 12.0–15.0)
MCH: 29.5 pg (ref 26.0–34.0)
MCHC: 32.9 g/dL (ref 30.0–36.0)
MCV: 89.6 fL (ref 80.0–100.0)
Platelets: 349 K/uL (ref 150–400)
RBC: 4.54 MIL/uL (ref 3.87–5.11)
RDW: 13.6 % (ref 11.5–15.5)
WBC: 11.9 K/uL — ABNORMAL HIGH (ref 4.0–10.5)
nRBC: 0 % (ref 0.0–0.2)

## 2024-03-31 LAB — BASIC METABOLIC PANEL WITH GFR
Anion gap: 10 (ref 5–15)
BUN: 14 mg/dL (ref 6–20)
CO2: 24 mmol/L (ref 22–32)
Calcium: 9.1 mg/dL (ref 8.9–10.3)
Chloride: 104 mmol/L (ref 98–111)
Creatinine, Ser: 0.84 mg/dL (ref 0.44–1.00)
GFR, Estimated: >60 mL/min (ref >60)
Glucose, Bld: 88 mg/dL (ref 70–99)
Potassium: 4.3 mmol/L (ref 3.5–5.1)
Sodium: 138 mmol/L (ref 135–145)

## 2024-03-31 LAB — D-DIMER, QUANTITATIVE: D-Dimer, Quant: <0.27 ug{FEU}/mL (ref 0.00–0.50)

## 2024-03-31 LAB — TROPONIN T, HIGH SENSITIVITY: Troponin T High Sensitivity: <15 ng/L (ref 0–19)

## 2024-03-31 NOTE — ED Triage Notes (Signed)
 Reports intermittent mid chest pain radiating to BIL sides of upper back, N/V, SHOB for 2 weeks.  No obvious signs of respiratory distress upon assessment   States was seen by 2 other ED's within 1 week for same symptoms.

## 2024-03-31 NOTE — ED Provider Notes (Signed)
 [facility]  Note:  This document was prepared using Conservation officer, historic buildings and may include unintentional dictation errors.  MRN: [id number] DOB: [birth year]  Subjective:   Maria Buckley is a 23 y.o. female presenting for acute on chronic anxiety flares.  Has also had very symptomatic chest tightness, nausea, vomiting, shakiness and chronic pain syndrome flares for the past 3 months.  Has had multiple emergency room visits.  Has had extensive workup.  A referral to rheumatology is pending and is set for December.  Patient has worked with her PCP consistently and also the emergency room for management of concurrent anxiety.  Is taking Prozac and had the dose increased recently, has been using hydroxyzine, was started on BuSpar recently as well.  She is on a beta-blocker too. No smoking of any kind including cigarettes, cigars, vaping, marijuana use.  No drug use.  No current facility-administered medications for this encounter.  Current Outpatient Medications:    Galcanezumab-gnlm (EMGALITY [place]), Inject into the skin., Disp: , Rfl:    ibuprofen  (ADVIL ) 800 MG tablet, Take 1 tablet (800 mg total) by mouth every 8 (eight) hours as needed., Disp: 30 tablet, Rfl: 0   ketorolac  (TORADOL ) 10 MG tablet, Take 1 tablet (10 mg total) by mouth every 6 (six) hours as needed., Disp: 20 tablet, Rfl: 0   levonorgestrel  (MIRENA ) 20 MCG/24HR IUD, 1 each by Intrauterine route once., Disp: , Rfl:    lidocaine  (XYLOCAINE ) 2 % solution, Use as directed 15 mLs in the mouth or throat every 4 (four) hours as needed for mouth pain., Disp: 150 mL, Rfl: 1   montelukast (SINGULAIR) 10 MG tablet, Take 10 mg by mouth daily., Disp: , Rfl:    nebivolol (BYSTOLIC) 2.5 MG tablet, Take 2.5 mg by mouth daily., Disp: , Rfl:    predniSONE  (DELTASONE ) 10 MG tablet, Take 6 tabs (60 mg) PO x 3 days, then take 4 tabs (40 mg) PO x 3 days, then take 2 tabs (20 mg) PO x 3 days, then take 1 tab (10 mg)  PO x 3 days, then take 1/2 tab (5 mg) PO x 4 days., Disp: 41 tablet, Rfl: 0   Allergies  Allergen Reactions   Mango Flavor [Flavoring Agent]    Mangifera Indica Nausea Only    Tongue numb and tingling    Past Medical History:  Diagnosis Date   Collapsed lung    S/P appendectomy 06/2018   Tachycardia      Past Surgical History:  Procedure Laterality Date   APPENDECTOMY  06/2018   GANGLION CYST EXCISION Right 11/22/2021   LAPAROSCOPIC APPENDECTOMY N/A 06/20/2018   Procedure: APPENDECTOMY LAPAROSCOPIC;  Surgeon: Tye Millet, DO;  Location: [facility] ORS;  Service: General;  Laterality: N/A;   TENDON REPAIR Right 11/22/2021   TONSILLECTOMY      Family History  Problem Relation Age of Onset   Scoliosis Mother    Other Mother        Disgenerative disc disease   Breast cancer Maternal Grandmother 63   Melanoma Maternal Grandmother        Malignant   Cancer Maternal Grandmother 96       has contact   Pancreatic cancer Other     Social History   Tobacco Use   Smoking status: Never    Passive exposure: Yes   Smokeless tobacco: Never  Vaping Use   Vaping status: Never Used  Substance Use Topics   Alcohol use: Yes  Comment: weekly   Drug use: No    ROS   Objective:   Vitals: BP 123/75 (BP Location: Right Arm)   Pulse 85   Temp 98.7 F (37.1 C) (Oral)   Resp 17   SpO2 98%   Physical Exam Constitutional:      General: She is not in acute distress.    Appearance: Normal appearance. She is well-developed. She is not ill-appearing, toxic-appearing or diaphoretic.  HENT:     Head: Normocephalic and atraumatic.     Nose: Nose normal.     Mouth/Throat:     Mouth: Mucous membranes are moist.  Eyes:     General: No scleral icterus.       Right eye: No discharge.        Left eye: No discharge.     Extraocular Movements: Extraocular movements intact.  Cardiovascular:     Rate and Rhythm: Normal rate.  Pulmonary:     Effort: Pulmonary effort is normal.   Skin:    General: Skin is warm and dry.  Neurological:     General: No focal deficit present.     Mental Status: She is alert and oriented to person, place, and time.  Psychiatric:        Attention and Perception: Attention and perception normal. She is attentive. She does not perceive auditory or visual hallucinations.        Mood and Affect: Mood normal. Mood is not anxious, depressed or elated. Affect is flat and tearful. Affect is not labile, blunt, angry or inappropriate.        Speech: She is communicative. Speech is not rapid and pressured, delayed, slurred or tangential.        Behavior: Behavior normal. Behavior is not agitated, slowed, aggressive, withdrawn, hyperactive or combative.        Thought Content: Thought content is not paranoid or delusional. Thought content does not include homicidal or suicidal ideation. Thought content does not include homicidal or suicidal plan.        Cognition and Memory: Cognition is not impaired. Memory is not impaired. She does not exhibit impaired recent memory or impaired remote memory.        Judgment: Judgment is not impulsive or inappropriate.    Recent Results (from the past 2160 hours)  Basic metabolic panel     Status: None   Collection Time: 03/31/24 11:18 AM  Result Value Ref Range   Sodium 138 135 - 145 mmol/L   Potassium 4.3 3.5 - 5.1 mmol/L   Chloride 104 98 - 111 mmol/L   CO2 24 22 - 32 mmol/L   Glucose, Bld 88 70 - 99 mg/dL    Comment: Glucose reference range applies only to samples taken after fasting for at least 8 hours.   BUN 14 6 - 20 mg/dL   Creatinine, Ser 9.15 0.44 - 1.00 mg/dL   Calcium 9.1 8.9 - 89.6 mg/dL   GFR, Estimated >39 >39 mL/min    Comment: (NOTE) Calculated using the CKD-EPI Creatinine Equation (2021)    Anion gap 10 5 - 15    Comment: Performed at [facility], [address] [place], [place] [address]  CBC     Status: Abnormal   Collection Time: 03/31/24 11:18 AM  Result Value Ref  Range   WBC 11.9 (H) 4.0 - 10.5 K/uL   RBC 4.54 3.87 - 5.11 MIL/uL   Hemoglobin 13.4 12.0 - 15.0 g/dL   HCT 59.2 63.9 -  46.0 %   MCV 89.6 80.0 - 100.0 fL   MCH 29.5 26.0 - 34.0 pg   MCHC 32.9 30.0 - 36.0 g/dL   RDW 86.3 88.4 - 84.4 %   Platelets 349 150 - 400 K/uL   nRBC 0.0 0.0 - 0.2 %    Comment: Performed at [facility], [address] [place], [place] [address]  Troponin T, High Sensitivity     Status: None   Collection Time: 03/31/24 11:18 AM  Result Value Ref Range   Troponin T High Sensitivity <15 0 - 19 ng/L    Comment: (NOTE) Biotin concentrations > 1000 ng/mL falsely decrease TnT results.  Serial cardiac troponin measurements are suggested.  Refer to the Links section for chest pain algorithms and additional  guidance. Performed at [facility], [address] [place], [place] [address]   D-dimer, quantitative     Status: None   Collection Time: 03/31/24 12:38 PM  Result Value Ref Range   D-Dimer, Quant <0.27 0.00 - 0.50 ug/mL-FEU    Comment: (NOTE) At the manufacturer cut-off value of 0.5 g/mL FEU, this assay has a negative predictive value of 95-100%.This assay is intended for use in conjunction with a clinical pretest probability (PTP) assessment model to exclude pulmonary embolism (PE) and deep venous thrombosis (DVT) in outpatients suspected of PE or DVT. Results should be correlated with clinical presentation. Performed at [facility], [address], [place], [place] [address]      DG Chest 2 View Result Date: 03/31/2024 CLINICAL DATA:  Chest pain. EXAM: CHEST - 2 VIEW COMPARISON:  08/01/2023. FINDINGS: The heart size and mediastinal contours are within normal limits. Both lungs are clear. No pleural effusion or pneumothorax. The visualized skeletal structures are unremarkable. IMPRESSION: No acute cardiopulmonary findings. Electronically Signed   By: Harrietta Sherry M.D.   On: 03/31/2024 12:53   US  THYROID  Result  Date: 01/02/2024 CLINICAL DATA:  thyroiditis EXAM: THYROID  ULTRASOUND TECHNIQUE: Ultrasound examination of the thyroid  gland and adjacent soft tissues was performed. COMPARISON:  April 28, 2022 FINDINGS: Parenchymal Echotexture: Normal Isthmus: 0.3 cm in diameter Right lobe: 4.6 x 1.2 x 1.4 cm. Left lobe: 3.5 x 1.2 x 1.4 cm. _________________________________________________________ Estimated total number of nodules >/= 1 cm: 0 Number of spongiform nodules >/=  2 cm not described below (TR1): 0 Number of mixed cystic and solid nodules >/= 1.5 cm not described below (TR2): 0 _________________________________________________________ No discrete nodules are seen within the thyroid  gland. IMPRESSION: Unremarkable thyroid  ultrasound. No suspicious thyroid  nodules identified. The above is in keeping with the ACR TI-RADS recommendations - J Am Coll Radiol 2017;14:587-595. Electronically Signed   By: Michaeline Blanch M.D.   On: 01/02/2024 11:58     Assessment and Plan :   PDMP not reviewed this encounter.  1. Chest tightness   2. Chronic pain syndrome   3. Anxiety    Extensive lab and radiology review completed.  Patient is hemodynamically stable and appropriate for outpatient management.  She is not responding to multiple modalities for management of her anxiety.  Offered her a referral to behavioral health for more specialized management.  Otherwise follow-up with PCP.   Christopher Savannah, [place] 03/31/24 [phone]

## 2024-03-31 NOTE — ED Triage Notes (Signed)
 Pt c/o chest tightness, n/v, shaky-sx intermittent x 3 weeks-states she has been seen for same ~3 times with ED and PCP visits -[facility] ED today to be seen by PCP when she was given an appt then was advised PCP could not bill her insurance today diue to the ED visit-pt returned to ED and was told she was taken off the list-states she had EKG and blood work at [facility]

## 2024-04-12 ENCOUNTER — Emergency Department
Admission: EM | Admit: 2024-04-12 | Discharge: 2024-04-12 | Disposition: A | Discharge status: Home / Self Care | Payer: Self-pay | Attending: Emergency Medicine | Admitting: Emergency Medicine

## 2024-04-12 ENCOUNTER — Encounter: Payer: Self-pay | Admitting: Emergency Medicine

## 2024-04-12 ENCOUNTER — Other Ambulatory Visit: Payer: Self-pay

## 2024-04-12 DIAGNOSIS — Z79899 Other long term (current) drug therapy: Secondary | ICD-10-CM | POA: Diagnosis not present

## 2024-04-12 DIAGNOSIS — G43809 Other migraine, not intractable, without status migrainosus: Secondary | ICD-10-CM | POA: Diagnosis not present

## 2024-04-12 DIAGNOSIS — R6884 Jaw pain: Secondary | ICD-10-CM | POA: Diagnosis not present

## 2024-04-12 DIAGNOSIS — R519 Headache, unspecified: Secondary | ICD-10-CM | POA: Diagnosis present

## 2024-04-12 MED ORDER — DROPERIDOL 2.5 MG/ML IJ SOLN
2.5000 mg | Freq: Once | INTRAMUSCULAR | Status: AC
  Administered 2024-04-12: 2.5 mg via INTRAMUSCULAR
  Filled 2024-04-12: qty 2

## 2024-04-12 MED ORDER — KETOROLAC TROMETHAMINE 30 MG/ML IJ SOLN
30.0000 mg | Freq: Once | INTRAMUSCULAR | Status: AC
Start: 2024-04-12 — End: 2024-04-12
  Administered 2024-04-12: 30 mg via INTRAMUSCULAR
  Filled 2024-04-12: qty 1

## 2024-04-12 NOTE — Discharge Instructions (Signed)
 You are seen in the emergency department for a headache.  You are given medication for your headache in the emergency department with improvement.  No signs of an ear infection.  You can alternate Motrin  and Tylenol  for pain control.  Follow-up with your neurologist and your dentist.  Return to the emergency department for any worsening symptoms.  Pain control:  Ibuprofen  (motrin /aleve/advil ) - You can take 3 tablets (600 mg) every 6 hours as needed for pain/fever.  Acetaminophen  (tylenol ) - You can take 2 extra strength tablets (1000 mg) every 6 hours as needed for pain/fever.  You can alternate these medications or take them together.  Make sure you eat food/drink water when taking these medications.

## 2024-04-12 NOTE — ED Triage Notes (Signed)
 Patient to ED via POV for left sided jaw pain. Started this AM when waking up. States also having a headache for the past week on the same side.

## 2024-04-12 NOTE — ED Provider Notes (Signed)
 [facility] Provider Note    Event Date/Time   First MD Initiated Contact with Patient 04/12/24 [phone]     (approximate)   History   Jaw Pain   HPI  Maria Buckley is a 23 y.o. female past medical history significant for migraine headache who presents to the emergency department with a headache.  States that she started having a headache yesterday on the left side that is radiating to her jaw.  States that it feels slightly different from her prior migraine headaches.  Does follow with neurology for migraines.  States that it is normally a sharp stabbing pain.  Denies any significant change in vision but feels like her left eye is twitching.  Denies any significant nausea or vomiting.  No abdominal pain.  Denies any concern for pregnancy.  No dysuria, urinary urgency or frequency.  No falls or trauma.  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  No chest pain or shortness of breath.  No history of DVT or PE.     Physical Exam   Triage Vital Signs: ED Triage Vitals  Encounter Vitals Group     BP 04/12/24 0854 134/87     Girls Systolic BP Percentile --      Girls Diastolic BP Percentile --      Boys Systolic BP Percentile --      Boys Diastolic BP Percentile --      Pulse Rate 04/12/24 0854 86     Resp 04/12/24 0854 17     Temp 04/12/24 0854 98.2 F (36.8 C)     Temp Source 04/12/24 0854 Oral     SpO2 04/12/24 0854 96 %     Weight 04/12/24 0853 190 lb (86.2 kg)     Height 04/12/24 0853 5' 3 (1.6 m)     Head Circumference --      Peak Flow --      Pain Score 04/12/24 0853 8     Pain Loc --      Pain Education --      Exclude from Growth Chart --     Most recent vital signs: Vitals:   04/12/24 0854  BP: 134/87  Pulse: 86  Resp: 17  Temp: 98.2 F (36.8 C)  SpO2: 96%    Physical Exam Constitutional:      Appearance: She is well-developed.  HENT:     Head: Atraumatic.     Comments: Normal dentition    Right Ear: Tympanic membrane  normal.     Left Ear: Tympanic membrane normal.  Eyes:     Conjunctiva/sclera: Conjunctivae normal.     Comments: No nystagmus.  Pupils are equal and reactive  Cardiovascular:     Rate and Rhythm: Regular rhythm.  Pulmonary:     Effort: No respiratory distress.  Abdominal:     General: There is no distension.  Musculoskeletal:        General: Normal range of motion.     Cervical back: Normal range of motion and neck supple. No tenderness.  Skin:    General: Skin is warm.  Neurological:     Mental Status: She is alert. Mental status is at baseline.     GCS: GCS eye subscore is 4. GCS verbal subscore is 5. GCS motor subscore is 6.     Cranial Nerves: Cranial nerves 2-12 are intact.     Sensory: Sensation is intact.     Motor: Motor function is intact.  Coordination: Coordination is intact.      IMPRESSION / MDM / ASSESSMENT AND PLAN / ED COURSE  I reviewed the triage vital signs and the nursing notes.  Differential diagnosis including primary headache including migraine headache, TMJ.  No obvious dental caries and have a low suspicion for dental source of her pain.  No meningismus and have a low suspicion for meningitis.  No fever or altered mental status.  Patient has a nonfocal neurologic exam.  No sudden onset and headache and not the worst headache of her life, low suspicion for subarachnoid hemorrhage.  Patient adamantly denies that she cannot be pregnant.  On chart review I do see that the patient has a history of headaches and is followed by neuro at [facility] neurology for neurology headaches and face pain.   Labs (all labs ordered are listed, but only abnormal results are displayed) Labs interpreted as -    Labs Reviewed - No data to display  Patient given TauroLock and droperidol since she wanted to start with IM injections.  If unable to improve her headache will switch to an IV.  No change in vision in a young age, have low suspicion for giant cell arteritis.   Pupils are equal and reactive, doubt glaucoma.  Reevaluation states she is feeling much better.  Continues to have a nonfocal neurologic exam.  Complaining of some mild ongoing throbbing pain to her left jaw.  Feels like she can go home now.  Discussed return precautions for any ongoing or worsening symptoms.  Discussed follow-up with her neurologist and her dentist.  No questions at time of discharge.     PROCEDURES:  Critical Care performed: No  Procedures  Patient's presentation is most consistent with acute presentation with potential threat to life or bodily function.   MEDICATIONS ORDERED IN ED: Medications  ketorolac  (TORADOL ) 30 MG/ML injection 30 mg (30 mg Intramuscular Given 04/12/24 0917)  droperidol (INAPSINE) 2.5 MG/ML injection 2.5 mg (2.5 mg Intramuscular Given 04/12/24 0918)    FINAL CLINICAL IMPRESSION(S) / ED DIAGNOSES   Final diagnoses:  Other migraine without status migrainosus, not intractable  Jaw pain     Rx / DC Orders   ED Discharge Orders     None        Note:  This document was prepared using Dragon voice recognition software and may include unintentional dictation errors.   Suzanne Kirsch, MD 04/12/24 1023

## 2024-04-13 DIAGNOSIS — F419 Anxiety disorder, unspecified: Secondary | ICD-10-CM | POA: Insufficient documentation

## 2024-04-13 DIAGNOSIS — F331 Major depressive disorder, recurrent, moderate: Secondary | ICD-10-CM | POA: Insufficient documentation

## 2024-05-30 ENCOUNTER — Encounter (HOSPITAL_COMMUNITY): Payer: Self-pay

## 2024-05-30 ENCOUNTER — Ambulatory Visit (HOSPITAL_COMMUNITY): Payer: Self-pay | Admitting: Family

## 2024-06-07 ENCOUNTER — Ambulatory Visit
Admission: EM | Admit: 2024-06-07 | Discharge: 2024-06-07 | Disposition: A | Discharge status: Home / Self Care | Attending: Family Medicine | Admitting: Family Medicine

## 2024-06-07 ENCOUNTER — Other Ambulatory Visit: Payer: Self-pay

## 2024-06-07 DIAGNOSIS — M26622 Arthralgia of left temporomandibular joint: Secondary | ICD-10-CM | POA: Diagnosis not present

## 2024-06-07 MED ORDER — PREDNISONE 20 MG PO TABS: ORAL_TABLET | ORAL | 0 refills | Status: DC

## 2024-06-07 NOTE — Discharge Instructions (Addendum)
 Advised patient take medication as directed with food to completion.  Encouraged to increase daily water intake to 64 ounces per day while taking this medication.  Advised if symptoms worsen and/or unresolved please follow-up with your PCP, ENT, or here for further evaluation.  Contact information provided with this AVS today.

## 2024-06-07 NOTE — ED Provider Notes (Signed)
 [facility]    CSN: [id number] Arrival date & time: 06/07/24  1023      History   Chief Complaint Chief Complaint  Patient presents with   Otalgia    HPI Maria Buckley is a 23 y.o. female.   HPI 23 year old female presents with left ear pain that radiates to jaw for 2 weeks.  PMH significant for obesity and tachycardia.  Patient reports grinding her teeth at night.  Past Medical History:  Diagnosis Date   Collapsed lung    S/P appendectomy 06/2018   Tachycardia     Patient Active Problem List   Diagnosis Date Noted   Viral URI 04/26/2022   Acute appendicitis 06/20/2018    Past Surgical History:  Procedure Laterality Date   APPENDECTOMY  06/2018   GANGLION CYST EXCISION Right 11/22/2021   LAPAROSCOPIC APPENDECTOMY N/A 06/20/2018   Procedure: APPENDECTOMY LAPAROSCOPIC;  Surgeon: Tye Millet, DO;  Location: [facility] ORS;  Service: General;  Laterality: N/A;   TENDON REPAIR Right 11/22/2021   TONSILLECTOMY      OB History     Gravida  0   Para  0   Term  0   Preterm  0   AB  0   Living  0      SAB  0   IAB  0   Ectopic  0   Multiple  0   Live Births  0            Home Medications    Prior to Admission medications   Medication Sig Start Date End Date Taking? Authorizing Provider  predniSONE  (DELTASONE ) 20 MG tablet Take 3 tabs PO daily x 5 days. 06/07/24  Yes Teddy Sharper, FNP  Galcanezumab-gnlm (EMGALITY [place]) Inject into the skin.    [provider]  ibuprofen  (ADVIL ) 800 MG tablet Take 1 tablet (800 mg total) by mouth every 8 (eight) hours as needed. 04/28/22   Ward, Josette LOISE, DO  ketorolac  (TORADOL ) 10 MG tablet Take 1 tablet (10 mg total) by mouth every 6 (six) hours as needed. 04/03/22   Dorothyann Drivers, MD  levonorgestrel  (MIRENA ) 20 MCG/24HR IUD 1 each by Intrauterine route once.    [provider]  lidocaine  (XYLOCAINE ) 2 % solution Use as directed 15 mLs in the mouth or throat every 4 (four)  hours as needed for mouth pain. 04/28/22   Ward, Josette LOISE, DO  montelukast (SINGULAIR) 10 MG tablet Take 10 mg by mouth daily. 09/08/18   [provider]  nebivolol (BYSTOLIC) 2.5 MG tablet Take 2.5 mg by mouth daily. 04/16/21   [provider]    Family History Family History  Problem Relation Age of Onset   Scoliosis Mother    Other Mother        Disgenerative disc disease   Breast cancer Maternal Grandmother 15   Melanoma Maternal Grandmother        Malignant   Cancer Maternal Grandmother 77       has contact   Pancreatic cancer Other     Social History Social History   Tobacco Use   Smoking status: Never    Passive exposure: Yes   Smokeless tobacco: Never  Vaping Use   Vaping status: Never Used  Substance Use Topics   Alcohol use: Yes    Comment: weekly   Drug use: No     Allergies   Mango flavor [flavoring agent] and Mangifera indica   Review of Systems Review of Systems  Physical Exam Triage Vital Signs ED Triage Vitals  Encounter Vitals Group     BP      Girls Systolic BP Percentile      Girls Diastolic BP Percentile      Boys Systolic BP Percentile      Boys Diastolic BP Percentile      Pulse      Resp      Temp      Temp src      SpO2      Weight      Height      Head Circumference      Peak Flow      Pain Score      Pain Loc      Pain Education      Exclude from Growth Chart    No data found.  Updated Vital Signs BP 131/87 (BP Location: Right Arm)   Pulse 70   Temp 99 F (37.2 C) (Oral)   Resp 18   Ht 5' 3 (1.6 m)   Wt 190 lb (86.2 kg)   SpO2 99%   BMI 33.66 kg/m    Physical Exam Vitals and nursing note reviewed.  Constitutional:      Appearance: Normal appearance. She is obese.  HENT:     Head: Normocephalic and atraumatic.     Right Ear: Tympanic membrane, ear canal and external ear normal.     Left Ear: Tympanic membrane, ear canal and external ear normal.     Mouth/Throat:     Mouth: Mucous  membranes are moist.     Pharynx: Oropharynx is clear.     Comments: Audible click noted when palpating TMJ bilaterally  Eyes:     Extraocular Movements: Extraocular movements intact.     Conjunctiva/sclera: Conjunctivae normal.     Pupils: Pupils are equal, round, and reactive to light.  Cardiovascular:     Rate and Rhythm: Normal rate and regular rhythm.     Heart sounds: Normal heart sounds. No murmur heard. Pulmonary:     Effort: Pulmonary effort is normal.     Breath sounds: Normal breath sounds. No wheezing, rhonchi or rales.  Musculoskeletal:        General: Normal range of motion.  Skin:    General: Skin is warm and dry.  Neurological:     General: No focal deficit present.     Mental Status: She is alert and oriented to person, place, and time. Mental status is at baseline.      UC Treatments / Results  Labs (all labs ordered are listed, but only abnormal results are displayed) Labs Reviewed - No data to display  EKG   Radiology No results found.  Procedures Procedures (including critical care time)  Medications Ordered in UC Medications - No data to display  Initial Impression / Assessment and Plan / UC Course  I have reviewed the triage vital signs and the nursing notes.  Pertinent labs & imaging results that were available during my care of the patient were reviewed by me and considered in my medical decision making (see chart for details).     MDM: 1.  TMJ tenderness, left-Rx'd prednisone  20 mg tablet: Take 3 tablets p.o. daily x 5 days. Advised patient take medication as directed with food to completion.  Encouraged to increase daily water intake to 64 ounces per day while taking this medication.  Advised if symptoms worsen and/or unresolved please follow-up with your PCP, ENT, or here for  further evaluation.  Contact information provided with this AVS today.  Patient discharged home, hemodynamically stable Final Clinical Impressions(s) / UC Diagnoses    Final diagnoses:  TMJ tenderness, left     Discharge Instructions      Advised patient take medication as directed with food to completion.  Encouraged to increase daily water intake to 64 ounces per day while taking this medication.  Advised if symptoms worsen and/or unresolved please follow-up with your PCP, ENT, or here for further evaluation.  Contact information provided with this AVS today.     ED Prescriptions     Medication Sig Dispense Auth. Provider   predniSONE  (DELTASONE ) 20 MG tablet Take 3 tabs PO daily x 5 days. 15 tablet Anida Deol, FNP      PDMP not reviewed this encounter.   Teddy Sharper, FNP 06/07/24 1056

## 2024-06-07 NOTE — ED Triage Notes (Signed)
 Pt presenting c/o left ear pain x 2 weeks . Pt stated that she observed small amt of clear drainage from lt ear yesterday and this AM. Pt stated that pain radiates to her left jaw. No medication taken for symptoms.

## 2024-06-13 NOTE — Progress Notes (Signed)
 Office Visit Note  Patient: Maria Buckley             Date of Birth: 07-25-2000           MRN: [id number]             PCP: Pcp, No Referring: Dicky Anes, MD Visit Date: 06/27/2024 Occupation: Data Unavailable  Subjective:  Pain in joints and muscles  History of Present Illness: Maria Buckley is a 23 y.o. female seen for the evaluation of positive ANA, polyarthralgia and myalgia.  According the patient at age 72 she had pneumonia and atelectasis.  She had residual shortness of breath and took antibiotics for a long time.  She states she used to play soccer and had difficulty playing soccer.  She was diagnosed with exercise-induced asthma.  She also had frequent sprains in her tendons and muscles.  She went to physical therapy for a long time.  She states in high school while she was age 5 she was having recurrent chest pain and shortness of breath.  She was seen by a cardiologist and was diagnosed with POTS.  She states she started experiencing fatigue and chronic pain.  She tried to change her sleeping schedule and made dietary modifications which helped temporarily.  She states at age 1 she was started on a beta-blocker which she take for several years but it was discontinued due to hypotension and dizziness.  She says the fatigue and pain persist.  She states she did well until she was 12 then early this year she went to [place] where she had to do a lot of walking however she took her rest as needed.  1 week after coming back from [place] she started having severe lower extremity pain and difficulty walking and muscle weakness.  She states she went to the emergency room several times due to severe pain.  She was also seen by her PCP who did labs and her ANA was positive however the ENA panel was negative.  She was given a steroid taper in June which did not improve her symptoms.  She states she continued to have difficulty walking and tripped 1 time.  She states since then she has  had several visits to the emergency room and to her PCP.  She also has been seeing a neurologist at [facility] for at least 40 years for headaches.  She has had MRI of her lumbar spine.  She has not mention muscle weakness to her neurologist yet.  She states the pain in her lower extremities radiates up her body.  She also had chest pain radiating to her back.  She was seen in the emergency room and was advised to do yoga and stretching.  She recalls seeing a gastroenterologist in high school due to chronic constipation diarrhea and intermittent bleeding in her stool.  At the time she had endoscopy and colonoscopy but no cause was established.  She complains of pain and discomfort in her neck, shoulders, wrist, hands, hips, knees, ankles and her feet.  She notices swelling in her hands, knees and her feet.  She also notices intermittent rash which itches. There is family history of systemic lupus and Crohn's disease in her first cousins.  She is right-handed, energy manager.  She enjoys painting and cooking.  She also does low impact desk exercise.She enjoys skiing and goes for Pilates. She is single, gravida 0.  Birth control method is IUD.  She drinks alcoholic beverage about once  a week.  She has never been a smoker.      Activities of Daily Living:  Patient reports morning stiffness for 2 hours.   Patient Reports nocturnal pain.  Difficulty dressing/grooming: Reports Difficulty climbing stairs: Reports Difficulty getting out of chair: Reports Difficulty using hands for taps, buttons, cutlery, and/or writing: Reports  Review of Systems  Constitutional:  Positive for fatigue.  HENT:  Positive for mouth dryness. Negative for mouth sores.   Eyes:  Positive for dryness.  Respiratory:  Positive for shortness of breath.   Cardiovascular:  Positive for chest pain and palpitations.  Gastrointestinal:  Positive for blood in stool, constipation and diarrhea.  Endocrine: Negative for increased  urination.  Genitourinary:  Negative for involuntary urination.  Musculoskeletal:  Positive for joint pain, gait problem, joint pain, joint swelling, myalgias, muscle weakness, morning stiffness, muscle tenderness and myalgias.  Skin:  Positive for rash and sensitivity to sunlight. Negative for color change.  Allergic/Immunologic: Positive for susceptible to infections.  Neurological:  Positive for dizziness and headaches.  Hematological:  Positive for swollen glands.  Psychiatric/Behavioral:  Positive for depressed mood and sleep disturbance. The patient is nervous/anxious.     PMFS History:  Patient Active Problem List   Diagnosis Date Noted   POTS (postural orthostatic tachycardia syndrome) 06/27/2024   Anxiety and depression 06/27/2024   Chronic migraine without aura without status migrainosus, not intractable 06/27/2024   B12 deficiency 06/27/2024   Hypermobility syndrome 06/27/2024   Other chronic pain 06/27/2024   Viral URI 04/26/2022   Acute appendicitis 06/20/2018    Past Medical History:  Diagnosis Date   Collapsed lung    S/P appendectomy 06/2018   Tachycardia     Family History  Problem Relation Age of Onset   Scoliosis Mother    Other Mother        Disgenerative disc disease   Hypertension Father    Autism spectrum disorder Brother    ADD / ADHD Brother    Breast cancer Maternal Grandmother 32   Melanoma Maternal Grandmother        Malignant   Cancer Maternal Grandmother 68       has contact   Pancreatic cancer Other    Past Surgical History:  Procedure Laterality Date   APPENDECTOMY  06/2018   GANGLION CYST EXCISION Right 11/22/2021   LAPAROSCOPIC APPENDECTOMY N/A 06/20/2018   Procedure: APPENDECTOMY LAPAROSCOPIC;  Surgeon: Tye Millet, DO;  Location: [facility] ORS;  Service: General;  Laterality: N/A;   TENDON REPAIR Right 11/22/2021   TONSILLECTOMY     Social History   Tobacco Use   Smoking status: Never    Passive exposure: Past   Smokeless  tobacco: Never  Vaping Use   Vaping status: Never Used  Substance Use Topics   Alcohol use: Yes    Comment: weekly   Drug use: No   Social History   Social History Narrative   Not on file     Immunization History  Administered Date(s) Administered   Dtap, Unspecified 06/08/2000, 08/10/2000, 10/05/2000, 07/20/2001, 12/11/2004   HIB, Unspecified 06/08/2000, 08/10/2000, 10/05/2000, 04/21/2001   HPV 9-valent 04/20/2019   HPV Quadrivalent 03/26/2012   Hep B, Unspecified 2000/01/11, 08/10/2000, 10/05/2000   Hepatitis A, Ped/Adol-2 Dose 11/17/2008, 12/31/2010   Influenza Inj Mdck Quad Pf 09/08/2018   Influenza Nasal 04/11/2011   Influenza,Quad,Nasal, Live 07/19/2012   Influenza,inj,Quad PF,6+ Mos 06/21/2018   Influenza-Unspecified 07/04/2013, 03/21/2015, 05/20/2016, 06/21/2018, 09/08/2018   MMR 04/21/2001, 12/11/2004   Meningococcal B, OMV 12/01/2017,  04/20/2019   Meningococcal Mcv4o 03/26/2012, 12/01/2017   Pneumococcal-Unspecified 06/08/2000, 08/10/2000   Polio, Unspecified 06/08/2000, 10/05/2000, 04/21/2001, 12/11/2004   Tdap 02/14/2010, 05/20/2016   Varicella 07/20/2001, 11/17/2008     Objective: Vital Signs: BP 130/83 (BP Location: Right Arm, Patient Position: Sitting, Cuff Size: Normal)   Pulse 96   Temp 99.1 F (37.3 C)   Resp 16   Ht 5' 3 (1.6 m)   Wt 207 lb 9.6 oz (94.2 kg)   BMI 36.77 kg/m    Physical Exam Vitals and nursing note reviewed.  Constitutional:      Appearance: She is well-developed.  HENT:     Head: Normocephalic and atraumatic.  Eyes:     Conjunctiva/sclera: Conjunctivae normal.  Cardiovascular:     Rate and Rhythm: Normal rate and regular rhythm.     Heart sounds: Normal heart sounds.  Pulmonary:     Effort: Pulmonary effort is normal.     Breath sounds: Normal breath sounds.  Abdominal:     General: Bowel sounds are normal.     Palpations: Abdomen is soft.  Musculoskeletal:     Cervical back: Normal range of motion.  Skin:     General: Skin is warm and dry.     Capillary Refill: Capillary refill takes less than 2 seconds.  Neurological:     Mental Status: She is alert and oriented to person, place, and time.  Psychiatric:        Behavior: Behavior normal.      Musculoskeletal Exam: Cervical, thoracic and lumbar spine were in good range of motion.  There was no SI joint tenderness.  Shoulder joints, elbow joints, wrist joints, MCPs, PIPs and DIPs were in good range of motion with no synovitis.  Hip joints and knee joints were in good range of motion without any warmth swelling or effusion.  There was no tenderness over ankles or MTPs.  She had hypermobility in most of her joints.  She was able to reach the floor with the palm of her hand.  She had generalized hyperalgesia and positive tender points.   CDAI Exam: CDAI Score: -- Patient Global: --; Provider Global: -- Swollen: --; Tender: -- Joint Exam 06/27/2024   No joint exam has been documented for this visit   There is currently no information documented on the homunculus. Go to the Rheumatology activity and complete the homunculus joint exam.  Investigation: No additional findings.  Imaging: No results found.  Recent Labs: Lab Results  Component Value Date   WBC 11.9 (H) 03/31/2024   HGB 13.4 03/31/2024   PLT 349 03/31/2024   NA 138 03/31/2024   K 4.3 03/31/2024   CL 104 03/31/2024   CO2 24 03/31/2024   GLUCOSE 88 03/31/2024   BUN 14 03/31/2024   CREATININE 0.84 03/31/2024   BILITOT 0.7 04/03/2022   ALKPHOS 74 04/03/2022   AST 19 04/03/2022   ALT 22 04/03/2022   PROT 8.3 (H) 04/03/2022   ALBUMIN 4.5 04/03/2022   CALCIUM 9.1 03/31/2024   GFRAA >60 06/20/2018   Nov 19, 2022 hepatitis C nonreactive December 17, 2023 ANA 1: 640 NH, 1: 640 NS, ENA (dsDNA, SSA, SSB, Smith, RNP, SCL 70, centromere, ribosomal P, chromatin, Jo 1) negative, anti-CCP negative, RF negative December 25, 2023 Lyme negative, HIV nonreactive, sed rate 6, CK 44 December 27, 2023  CRP 0.45, sed rate 13, uric acid 3.9, anti-CCP<0.5, TSH normal, antimicrosomal thyroid  antibody negative January 07, 2024 sed rate 4, CMP normal, CBC with  differential normal   Speciality Comments: No specialty comments available.  Procedures:  No procedures performed Allergies: Mango flavor [flavoring agent] and Mangifera indica   Assessment / Plan:     Visit Diagnoses: Positive ANA (antinuclear antibody) -patient was found to have positive ANA in June 2025.  ENA panel was negative and inflammatory markers were normal.  She gives history of fatigue, sicca symptoms, arthralgias, joint swelling, rash, photosensitivity and swollen glands.  On the examination today no synovitis was noted.  She had good capillary refill with no nailbed capillary changes.  No lymphadenopathy was noted.  I advised her to contact us  if she develops any increased symptoms.  I also discussed the option of doing ultrasound of her hands if she develops swelling in her hands.  I will repeat labs in 6 months.  Prevalence of positive ANA and normal population was discussed.  Increased risk of developing autoimmune disease and patient with positive ANA over the next 8 years was also discussed.  Patient reports taking prednisone  taper and did not notice any difference.  Use of prednisone  was discouraged.  Plan: Protein / creatinine ratio, urine, CBC with Differential/Platelet, Comprehensive metabolic panel with GFR, ANA, Anti-DNA antibody, double-stranded, C3 and C4, Sedimentation rate  Polyarthralgia-she complains of pain and discomfort in multiple joints including her neck, shoulders, wrist, hands, hips, knees, ankles and her feet.  No synovitis was noted.  She had tenderness over bilateral trochanteric bursae and also some tenderness over dorsal spurs.  Hypermobility syndrome-she had hypermobility in multiple joints.  I will refer her to sports medicine for evaluation.  POTS (postural orthostatic tachycardia syndrome)-she has been  diagnosed with POTS syndrome by her cardiologist at [facility].  She has been treated with multiple medications in the past.  Fibromyalgia-she had generalized hyperalgesia, positive tender points and chronic pain.  She meets the criteria for fibromyalgia syndrome.  Need for regular exercise and stretching was discussed.  Benefits of swimming, water aerobics, stretching and yoga were emphasized.  She is on Prozac.  Patient states she tried pregabalin and gabapentin but could not notice much difference.  She also takes muscle relaxers.  I will refer her to physical therapy.  Other fatigue-probably related to chronic insomnia and fibromyalgia.  Other insomnia-has history of chronic insomnia.  Good sleep hygiene was discussed.  Other chronic pain-he continues to have chronic pain.  Alternating constipation and diarrhea-patient gives history of alternating diarrhea and constipation.  She also gives history of intermittent bleeding.  She had a colonoscopy and endoscopy in high school.  Possibility of IBS was discussed.  Association of IBS and fibromyalgia was discussed.  Anxiety and depression -he is on fluoxetine 60 mg p.o. daily  Chronic migraine without aura without status migrainosus, not intractable - On Ubrelvy and tizanidine by her neurologist.  I advised her to discuss lower extremity weakness with her neurologist.  Patient reports having MRI of her lumbar spine in the past which was unremarkable.  She had no difficulty getting up from the squatting position.  CK was normal.  IUD (intrauterine device) in place  B12 deficiency  Family history of systemic lupus erythematosus-paternal first cousin  Family history of Crohn's disease-maternal first cousin  Orders: Orders Placed This Encounter  Procedures   Protein / creatinine ratio, urine   CBC with Differential/Platelet   Comprehensive metabolic panel with GFR   ANA   Anti-DNA antibody, double-stranded   C3 and C4   Sedimentation rate    AMB referral to sports medicine  Ambulatory referral to Physical Therapy   No orders of the defined types were placed in this encounter.   Face-to-face time spent with patient was over 45 minutes. Greater than 50% of time was spent in counseling and coordination of care.  Follow-Up Instructions: Return in about 6 months (around 12/26/2024) for +ANA.   Maya Nash, MD  Note - This record has been created using Animal nutritionist.  Chart creation errors have been sought, but may not always  have been located. Such creation errors do not reflect on  the standard of medical care.

## 2024-06-27 ENCOUNTER — Encounter: Payer: Self-pay | Admitting: Rheumatology

## 2024-06-27 ENCOUNTER — Ambulatory Visit: Attending: Rheumatology | Admitting: Rheumatology

## 2024-06-27 VITALS — BP 130/83 | HR 96 | Temp 99.1°F | Resp 16 | Ht 63.0 in | Wt 207.6 lb

## 2024-06-27 DIAGNOSIS — R7689 Other specified abnormal immunological findings in serum: Secondary | ICD-10-CM

## 2024-06-27 DIAGNOSIS — R198 Other specified symptoms and signs involving the digestive system and abdomen: Secondary | ICD-10-CM

## 2024-06-27 DIAGNOSIS — F32A Depression, unspecified: Secondary | ICD-10-CM

## 2024-06-27 DIAGNOSIS — R5383 Other fatigue: Secondary | ICD-10-CM | POA: Diagnosis not present

## 2024-06-27 DIAGNOSIS — G8929 Other chronic pain: Secondary | ICD-10-CM

## 2024-06-27 DIAGNOSIS — M357 Hypermobility syndrome: Secondary | ICD-10-CM

## 2024-06-27 DIAGNOSIS — E538 Deficiency of other specified B group vitamins: Secondary | ICD-10-CM | POA: Insufficient documentation

## 2024-06-27 DIAGNOSIS — G90A Postural orthostatic tachycardia syndrome (POTS): Secondary | ICD-10-CM | POA: Diagnosis not present

## 2024-06-27 DIAGNOSIS — M2550 Pain in unspecified joint: Secondary | ICD-10-CM | POA: Diagnosis not present

## 2024-06-27 DIAGNOSIS — G4709 Other insomnia: Secondary | ICD-10-CM | POA: Diagnosis not present

## 2024-06-27 DIAGNOSIS — Z975 Presence of (intrauterine) contraceptive device: Secondary | ICD-10-CM | POA: Diagnosis not present

## 2024-06-27 DIAGNOSIS — Z8379 Family history of other diseases of the digestive system: Secondary | ICD-10-CM

## 2024-06-27 DIAGNOSIS — Q7962 Hypermobile Ehlers-Danlos syndrome: Secondary | ICD-10-CM | POA: Insufficient documentation

## 2024-06-27 DIAGNOSIS — G43709 Chronic migraine without aura, not intractable, without status migrainosus: Secondary | ICD-10-CM | POA: Diagnosis not present

## 2024-06-27 DIAGNOSIS — M797 Fibromyalgia: Secondary | ICD-10-CM

## 2024-06-27 DIAGNOSIS — F419 Anxiety disorder, unspecified: Secondary | ICD-10-CM | POA: Insufficient documentation

## 2024-06-27 DIAGNOSIS — Z8269 Family history of other diseases of the musculoskeletal system and connective tissue: Secondary | ICD-10-CM

## 2024-06-27 NOTE — Patient Instructions (Signed)
Myofascial Pain Syndrome and Fibromyalgia Myofascial pain syndrome and fibromyalgia are both pain disorders. You may feel this pain mainly in your muscles. Myofascial pain syndrome: Always has tender points in the muscles that will cause pain when pressed (trigger points). The pain may come and go. Usually affects your neck, upper back, and shoulder areas. The pain often moves into your arms and hands. Fibromyalgia: Has muscle pains and tenderness that come and go. Is often associated with tiredness (fatigue) and sleep problems. Has trigger points. Tends to be long-lasting (chronic), but is not life-threatening. Fibromyalgia and myofascial pain syndrome are not the same. However, they often occur together. If you have both conditions, each can make the other worse. Both are common and can cause enough pain and fatigue to make day-to-day activities difficult. Both can be hard to diagnose because their symptoms are common in many other conditions. What are the causes? The exact causes of these conditions are not known. What increases the risk? You are more likely to develop either of these conditions if: You have a family history of the condition. You are female. You have certain triggers, such as: Spine disorders. An injury (trauma) or other physical stressors. Being under a lot of stress. Medical conditions such as osteoarthritis, rheumatoid arthritis, or lupus. What are the signs or symptoms? Fibromyalgia The main symptom of fibromyalgia is widespread pain and tenderness in your muscles. Pain is sometimes described as stabbing, shooting, or burning. You may also have: Tingling or numbness. Sleep problems and fatigue. Problems with attention and concentration (fibro fog). Other symptoms may include: Bowel and bladder problems. Headaches. Vision problems. Sensitivity to odors and noises. Depression or mood changes. Painful menstrual periods (dysmenorrhea). Dry skin or eyes. These  symptoms can vary over time. Myofascial pain syndrome Symptoms of myofascial pain syndrome include: Tight, ropy bands of muscle. Uncomfortable sensations in muscle areas. These may include aching, cramping, burning, numbness, tingling, and weakness. Difficulty moving certain parts of the body freely (poor range of motion). How is this diagnosed? This condition may be diagnosed by your symptoms and medical history. You will also have a physical exam. In general: Fibromyalgia is diagnosed if you have pain, fatigue, and other symptoms for more than 3 months, and symptoms cannot be explained by another condition. Myofascial pain syndrome is diagnosed if you have trigger points in your muscles, and those trigger points are tender and cause pain elsewhere in your body (referred pain). How is this treated? Treatment for these conditions depends on the type that you have. For fibromyalgia, a healthy lifestyle is the most important treatment including aerobic and strength exercises. Different types of medicines are used to help treat pain and include: NSAIDs. Medicines for treating depression. Medicines that help control seizures. Medicines that relax the muscles. Treatment for myofascial pain syndrome includes: Pain medicines, such as NSAIDs. Cooling and stretching of muscles. Massage therapy with myofascial release technique. Trigger point injections. Treating these conditions often requires a team of health care providers. These may include: Your primary care provider. A physical therapist. Complementary health care providers, such as massage therapists or acupuncturists. A psychiatrist for cognitive behavioral therapy. Follow these instructions at home: Medicines Take over-the-counter and prescription medicines only as told by your health care provider. Ask your health care provider if the medicine prescribed to you: Requires you to avoid driving or using machinery. Can cause constipation.  You may need to take these actions to prevent or treat constipation: Drink enough fluid to keep your urine pale   yellow. Take over-the-counter or prescription medicines. Eat foods that are high in fiber, such as beans, whole grains, and fresh fruits and vegetables. Limit foods that are high in fat and processed sugars, such as fried or sweet foods. Lifestyle  Do exercises as told by your health care provider or physical therapist. Practice relaxation techniques to control your stress. You may want to try: Biofeedback. Visual imagery. Hypnosis. Muscle relaxation. Yoga. Meditation. Maintain a healthy lifestyle. This includes eating a healthy diet and getting enough sleep. Do not use any products that contain nicotine or tobacco. These products include cigarettes, chewing tobacco, and vaping devices, such as e-cigarettes. If you need help quitting, ask your health care provider. General instructions Talk to your health care provider about complementary treatments, such as acupuncture or massage. Do not do activities that stress or strain your muscles. This includes repetitive motions and heavy lifting. Keep all follow-up visits. This is important. Where to find support Consider joining a support group with others who are diagnosed with this condition. National Fibromyalgia Association: fmaware.org Where to find more information [place]. Pain Foundation: uspainfoundation.org Contact a health care provider if: You have new symptoms. Your symptoms get worse or your pain is severe. You have side effects from your medicines. You have trouble sleeping. Your condition is causing depression or anxiety. Get help right away if: You have thoughts of hurting yourself or others. Get help right away if you feel like you may hurt yourself or others, or have thoughts about taking your own life. Go to your nearest emergency room or: Call 911. Call the National Suicide Prevention Lifeline at 1-800-273-8255  or 988. This is open 24 hours a day. Text the Crisis Text Line at 741741. This information is not intended to replace advice given to you by your health care provider. Make sure you discuss any questions you have with your health care provider. Document Revised: 04/07/2022 Document Reviewed: 05/31/2021 Elsevier Patient Education  2024 [employer]  

## 2024-07-19 ENCOUNTER — Encounter (HOSPITAL_COMMUNITY): Payer: Self-pay | Admitting: Family

## 2024-07-19 ENCOUNTER — Ambulatory Visit (HOSPITAL_COMMUNITY): Admitting: Family

## 2024-07-19 ENCOUNTER — Other Ambulatory Visit: Payer: Self-pay

## 2024-07-19 VITALS — BP 126/82 | HR 86 | Ht 63.0 in | Wt 209.0 lb

## 2024-07-19 DIAGNOSIS — F419 Anxiety disorder, unspecified: Secondary | ICD-10-CM | POA: Diagnosis not present

## 2024-07-19 DIAGNOSIS — F32A Depression, unspecified: Secondary | ICD-10-CM | POA: Diagnosis not present

## 2024-07-19 MED ORDER — PROPRANOLOL HCL 10 MG PO TABS: 10.0000 mg | ORAL_TABLET | Freq: Two times a day (BID) | ORAL | 0 refills | Status: AC
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
## 2024-07-26 ENCOUNTER — Other Ambulatory Visit: Payer: Self-pay

## 2024-07-26 NOTE — Progress Notes (Signed)
 Opened in error

## 2024-07-28 ENCOUNTER — Ambulatory Visit: Admitting: Rheumatology

## 2024-08-04 ENCOUNTER — Ambulatory Visit: Payer: Self-pay | Admitting: Family Medicine

## 2024-08-04 VITALS — BP 128/84 | HR 86 | Ht 63.0 in | Wt 201.0 lb

## 2024-08-04 DIAGNOSIS — M357 Hypermobility syndrome: Secondary | ICD-10-CM

## 2024-08-04 DIAGNOSIS — Q7962 Hypermobile Ehlers-Danlos syndrome: Secondary | ICD-10-CM | POA: Diagnosis not present

## 2024-08-04 DIAGNOSIS — G90A Postural orthostatic tachycardia syndrome (POTS): Secondary | ICD-10-CM

## 2024-08-04 MED ORDER — MIDODRINE HCL 2.5 MG PO TABS: 2.5000 mg | ORAL_TABLET | Freq: Three times a day (TID) | ORAL | 2 refills | Status: AC

## 2024-08-04 NOTE — Patient Instructions (Addendum)
 Thank you for coming in today.   I've sent a prescription for Midodrine  to your pharmacy.   Check back in 1 month  The Pain Management Workbook: Powerful CBT and Mindfulness Skills to Take Control of Pain and Reclaim Your Life https://www.zoffness.com/   Check out the book Disjointed Navigating the Diagnosis and Management of Hypermobile Ehlers-Danlos Syndrome and Hypermobility Spectrum Disorders.     For POTS symptoms: Consume 3 L water and 8-12 gram of salt per day    Check out these websites for exercises to try if you have POTS (CHOP Protocol, [place] Protocol, and [employer] Protocol) Https://www.taylor-robbins.com/ https://dean.info/    Guide to Prof. 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

## 2024-08-04 NOTE — Progress Notes (Signed)
 "        I, Ileana Collet, PhD, LAT, ATC acting as a scribe for Artist Lloyd, MD.  Maria Buckley is a 24 y.o. female who presents to [facility] today for evaluation of her hypermobility. She is also a pt of Dr. Dolphus. Hx of fibromyalgia.  MS: Chronic joint pain, Chronic wide spread muscle pain, Joint Hypermobility, Joint Instability, and Joint Subluxation, bilat hand, knees are hypermobile Skin/Immune reactions: Fragile Skin that tears easily, Soft Velvety Skin, Poor Wound Healing, Easy Bruising / Bleeding, Atrophic Scars, Abnormal Stretch Marks Before Puberty / without significant weight gain , and Rashes / Hives Neurological: Headache  Migraine ANS: Dysautonomia / POTS / Orthostatic Intolerance, Syncope / Pre-Syncope / Dizziness, Fatigue, Raynaud's , Exercise / Temperature Intolerance , and Anxiety / Panic Respiratory: Dyspnea and Chest Tightness CV: Tachycardia GI:  GERD, IBS with Diarrhea or Constipation, Painful ABD Bloating, and Frequent N/V Genitourinary: Pelvic Pain / Fullness / Pressure and Painful Intercourse Hands & Feet: Piezogenic Papules and Raynaud's Phenomenon   Treatments tried: current @ [facility], prior PT and OT  Pertinent review of systems: No fevers or chills  Relevant historical information: Positive ANA titer 1: 640 not certain if she does have a inflammatory connective tissue disorder.  Not currently on disease modifying antirheumatic's after being evaluated by rheumatology.  POTS/dysautonomia   Exam:  BP 128/84   Pulse 86   Ht 5' 3 (1.6 m)   Wt 201 lb (91.2 kg)   SpO2 94%   BMI 35.61 kg/m  General: Well Developed, well nourished, and in no acute distress.   MSK: Hypermobility evaluation.  Beighton score is positive with a score of 6/9. Ehlers-Danlos evaluation is positive with a score of 5.    Lab and Radiology Results No results found for this or any previous visit (from the past 72 hours). No results  found.     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  Additionally she did have a rheumatologic workup and did have a positive ANA titer 1: 640 but rheumatology did not think that her primary diagnosis is rheumatologic in nature.  Will treat with physical therapy for chronic pain secondary to hypermobility and Ehlers-Danlos syndrome.  [facility]. Consider medication options in the future.  Low-dose naltrexone may be an option.  As for POTS this already is diagnosed and managed with cardiology with ivabradine.  She does have propranolol  prescribed by psychiatry that she can take intermittently but does not taking propranolol  in addition to ivabradine regularly.  She has never been prescribed midodrine .  She does have salt and fluid goals and is try to exercise somewhat regularly with swimming. Plan to add midodrine  at low-dose and reassess.  Handouts and information provided for EDS POTS and mast cell activation syndrome.  Recheck in 1 month.  PDMP not reviewed this encounter. No orders of the defined types were placed in this encounter.  Meds ordered this encounter  Medications   midodrine  (PROAMATINE ) 2.5 MG tablet    Sig: Take 1 tablet (2.5 mg total) by mouth 3 (three) times daily with meals.    Dispense:  270 tablet    Refill:  2     Discussed warning signs or symptoms. Please see discharge instructions. Patient expresses understanding.   The above documentation has been reviewed and is accurate and  complete Maria Buckley, M.D.   "

## 2024-08-08 ENCOUNTER — Encounter: Payer: Self-pay | Admitting: Family Medicine

## 2024-08-12 ENCOUNTER — Telehealth (HOSPITAL_COMMUNITY): Admitting: Family

## 2024-08-12 DIAGNOSIS — F32A Depression, unspecified: Secondary | ICD-10-CM

## 2024-08-12 DIAGNOSIS — F419 Anxiety disorder, unspecified: Secondary | ICD-10-CM | POA: Diagnosis not present

## 2024-08-12 NOTE — Progress Notes (Signed)
 Virtual Visit via Video Note  I connected with Maria Buckley on 08/12/24 at  1:30 PM EST by a video enabled telemedicine application and verified that I am speaking with the correct person using two identifiers.  Location: Patient: Home Provider: Office   I discussed the limitations of evaluation and management by telemedicine and the availability of in person appointments. The patient expressed understanding and agreed to proceed.   I discussed the assessment and treatment plan with the patient. The patient was provided an opportunity to ask questions and all were answered. The patient agreed with the plan and demonstrated an understanding of the instructions.   The patient was advised to call back or seek an in-person evaluation if the symptoms worsen or if the condition fails to improve as anticipated.  I provided 10 minutes of non-face-to-face time during this encounter.   Staci LOISE Kerns, NP   [facility] MD/PA/NP OP Progress Note  08/12/2024 2:04 PM Maria Buckley  MRN:  [id number]  Chief Complaint: Medication management  HPI:  Maria Buckley 24 year old female presents for medication management follow-up appointment.  Seen and evaluated via virtual platform.  Per initial assessment completed GeneSight test.  Results reviewed.  May use as directed Pristiq and Fetzima.  Patient is currently prescribed Prozac 40 mg daily which she reports she has been taking and tolerating well.  States she continues to do well at current dose.  States she has not had any episodes related to anxiety or depression since being stabilized on Prozac.  Reported she has tried Prozac 80 mg which she was unable to tolerate.  Reports her anxiety/panic episodes have been few and far between which she attributes to social stressors.  Patient states she will defer her medication management to Dr. Joane.  Maria Buckley reports plans to follow-up with schedule genetic testing in a few weeks.  No additional concerns noted at  this visit.  Discussed consideration for starting gabapentin if anxiety symptoms persist or worsen previously initiated on Inderal  propranolol  denied that she has utilized medications thus far.  Patient reports she will contact office for additional follow-up if needed.  Support encouragement reassurance was provided.  Visit Diagnosis:    ICD-10-CM   1. Anxiety and depression  F41.9    F32.A       Past Psychiatric History:  history with moderate depression-controlled with Prozac 40 mg daily and stated she was diginose with generalized anxiety disorder.  Past Medical History:  Past Medical History:  Diagnosis Date   Collapsed lung    POTS (postural orthostatic tachycardia syndrome)    S/P appendectomy 06/2018   Tachycardia     Past Surgical History:  Procedure Laterality Date   APPENDECTOMY  06/2018   Fibromyalgia     GANGLION CYST EXCISION Right 11/22/2021   LAPAROSCOPIC APPENDECTOMY N/A 06/20/2018   Procedure: APPENDECTOMY LAPAROSCOPIC;  Surgeon: Tye Millet, DO;  Location: [facility] ORS;  Service: General;  Laterality: N/A;   TENDON REPAIR Right 11/22/2021   TONSILLECTOMY      Family Psychiatric History:   Family History:  Family History  Problem Relation Age of Onset   Scoliosis Mother    Other Mother        Disgenerative disc disease   Hypertension Father    Autism spectrum disorder Brother    ADD / ADHD Brother    Breast cancer Maternal Grandmother 57   Melanoma Maternal Grandmother        Malignant   Cancer Maternal Grandmother 15  has contact   Pancreatic cancer Other     Social History:  Social History   Socioeconomic History   Marital status: Single    Spouse name: Not on file   Number of children: Not on file   Years of education: Not on file   Highest education level: Not on file  Occupational History   Not on file  Tobacco Use   Smoking status: Never    Passive exposure: Past   Smokeless tobacco: Never  Vaping Use   Vaping status: Never  Used  Substance and Sexual Activity   Alcohol use: Yes    Comment: 2-3 drinks on the weekend   Drug use: No   Sexual activity: Yes    Birth control/protection: I.U.D.    Comment: Mirena   Other Topics Concern   Not on file  Social History Narrative   Not on file   Social Drivers of Health   Tobacco Use: Low Risk (07/19/2024)   Patient History    Smoking Tobacco Use: Never    Smokeless Tobacco Use: Never    Passive Exposure: Past  Recent Concern: Tobacco Use - Medium Risk (06/07/2024)   Patient History    Smoking Tobacco Use: Never    Smokeless Tobacco Use: Never    Passive Exposure: Yes  Financial Resource Strain: Low Risk  (01/20/2024)   Received from [facility]   Overall Financial Resource Strain (CARDIA)    Difficulty of Paying Living Expenses: Not very hard  Food Insecurity: No Food Insecurity (01/20/2024)   Received from [facility]   Epic    Within the past 12 months, you worried that your food would run out before you got the money to buy more.: Never true    Within the past 12 months, the food you bought just didn't last and you didn't have money to get more.: Never true  Transportation Needs: No Transportation Needs (01/20/2024)   Received from [facility] - Transportation    In the past 12 months, has lack of transportation kept you from medical appointments or from getting medications?: No    Lack of Transportation (Non-Medical): No  Physical Activity: Not on file  Stress: Not on file  Social Connections: Not on file  Depression (PHQ2-9): High Risk (07/19/2024)   Depression (PHQ2-9)    PHQ-2 Score: 13  Alcohol Screen: Not on file  Housing: Low Risk  (01/20/2024)   [facility]   Epic    In the last 12 months, was there a time when you were not able to pay the mortgage or rent on time?: No    In the past 12 months, how many times have you moved where you were living?: 0     At any time in the past 12 months, were you homeless or living in a shelter (including now)?: No  Utilities: Not At Risk (01/20/2024)   Received from [facility]   Epic    In the past 12 months has the electric, gas, oil, or water company threatened to shut off services in your home?: No  Health Literacy: Not on file    Allergies: Allergies[1]  Metabolic Disorder Labs: No results found for: HGBA1C, MPG No results found for: PROLACTIN No results found for: CHOL, TRIG, HDL, CHOLHDL, VLDL, LDLCALC No results found for: TSH  Therapeutic Level Labs: No results found for: LITHIUM No results found for: VALPROATE No results found for:  CBMZ  Current Medications: Current Outpatient Medications  Medication Sig Dispense Refill   AJOVY 225 MG/1.5ML SOSY Inject 225 mg into the skin.     diphenhydrAMINE  (BENADRYL ) 25 MG tablet Take 25 mg by mouth at bedtime.     eletriptan (RELPAX) 40 MG tablet Take 1 tab at onset of migraine. May repeat in 2 hours if needed. Max 2/day and 2-3 days/week     FLUoxetine (PROZAC) 40 MG capsule Take 40 mg by mouth daily.     fluticasone (FLONASE) 50 MCG/ACT nasal spray SPRAY 2 SPRAYS INTO EACH NOSTRIL EVERY DAY     ibuprofen  (ADVIL ) 800 MG tablet Take 1 tablet (800 mg total) by mouth every 8 (eight) hours as needed. 30 tablet 0   ivabradine (CORLANOR) 5 MG TABS tablet Take 5 mg by mouth daily.     levonorgestrel  (MIRENA ) 20 MCG/24HR IUD 1 each by Intrauterine route once.     loratadine  (CLARITIN ) 10 MG tablet Take 1 tablet by mouth daily.     midodrine  (PROAMATINE ) 2.5 MG tablet Take 1 tablet (2.5 mg total) by mouth 3 (three) times daily with meals. 270 tablet 2   montelukast (SINGULAIR) 10 MG tablet Take 10 mg by mouth daily.     ondansetron  (ZOFRAN -ODT) 4 MG disintegrating tablet Take 4 mg by mouth every 8 (eight) hours as needed. (Patient not taking: Reported on 08/04/2024)     propranolol  (INDERAL ) 10 MG tablet Take 1  tablet (10 mg total) by mouth 2 (two) times daily. 60 tablet 0   Riboflavin 400 MG TABS Take by mouth.     tiZANidine (ZANAFLEX) 4 MG tablet Take 4 mg by mouth 3 (three) times daily.     UBRELVY 100 MG TABS Take 1 tablet by mouth 2 (two) times daily as needed.     No current facility-administered medications for this visit.     Musculoskeletal: Virtual assessment   Psychiatric Specialty Exam: Review of Systems  There were no vitals taken for this visit.There is no height or weight on file to calculate BMI.  General Appearance: Casual  Eye Contact:  Good  Speech:  Clear and Coherent  Volume:  Normal  Mood:  Euthymic  Affect:  Congruent  Thought Process:  Coherent  Orientation:  Full (Time, Place, and Person)  Thought Content: Logical   Suicidal Thoughts:  No  Homicidal Thoughts:  No  Memory:  Immediate;   Good Recent;   Good  Judgement:  Good  Insight:  Good  Psychomotor Activity:  Normal  Concentration:  Concentration: Good  Recall:  Good  Fund of Knowledge: Good  Language: Good  Akathisia:  No  Handed:  Right  AIMS (if indicated): not done  Assets:  Communication Skills Desire for Improvement  ADL's:  Intact  Cognition: WNL  Sleep:  Good   Screenings: PHQ2-9    Flowsheet Row [facility]  PHQ-2 Total Score 2  PHQ-9 Total Score 13   Flowsheet Row UC from 06/07/2024 in [facility] ED from 04/12/2024 in [facility] at [facility] UC from 03/31/2024 in [facility] at [facility])  C-SSRS RISK CATEGORY No Risk No Risk No Risk     Assessment and Plan: Deshante Cassell 24 year old female presents for medication management follow-up appointment.  During this assessment reviewed GeneSight test results.  Patient opted to follow-up with primary care for additional genetic testing due to reported poor response  with  antidepressants in the past.  States she feels stabilized on current medication dose with Prozac 40 mg daily.  States her main concern is related to situational anxiety symptoms.  However denied that she has had any recent episodes or flareups related to anxiety symptoms.  Patient to follow-up as needed.  No other concerns noted at this visit.-Reports she has tried BuSpar in the past without symptoms relief.  Collaboration of Care: Collaboration of Care: Medication Management AEB continue Prozac 40 mg daily previous assessment patient was initiated on propranolol  10 mg p.o. twice daily for breakthrough anxiety.  Patient/Guardian was advised Release of Information must be obtained prior to any record release in order to collaborate their care with an outside provider. Patient/Guardian was advised if they have not already done so to contact the registration department to sign all necessary forms in order for us  to release information regarding their care.   Consent: Patient/Guardian gives verbal consent for treatment and assignment of benefits for services provided during this visit. Patient/Guardian expressed understanding and agreed to proceed.    Staci LOISE Kerns, NP 08/12/2024, 2:04 PM     [1]  Allergies Allergen Reactions   Mango Flavor [Flavoring Agent]    Mangifera Indica Nausea Only    Tongue numb and tingling

## 2024-08-16 MED ORDER — NALTREXONE POWD: 1.0000 mg | Freq: Every day | 1 refills | Status: AC

## 2024-09-05 ENCOUNTER — Ambulatory Visit: Admitting: Family Medicine

## 2024-12-29 ENCOUNTER — Ambulatory Visit: Payer: Self-pay | Admitting: Rheumatology
